# Patient Record
Sex: Female | Born: 1970
Health system: Southern US, Community
[De-identification: ages and names within clinical notes are randomized; demographics above are authoritative.]

## PROBLEM LIST (undated history)

## (undated) DIAGNOSIS — S060XAA Concussion with loss of consciousness status unknown, initial encounter: Secondary | ICD-10-CM

## (undated) DIAGNOSIS — E039 Hypothyroidism, unspecified: Secondary | ICD-10-CM

## (undated) DIAGNOSIS — M1710 Unilateral primary osteoarthritis, unspecified knee: Secondary | ICD-10-CM

## (undated) DIAGNOSIS — Z9889 Other specified postprocedural states: Secondary | ICD-10-CM

## (undated) DIAGNOSIS — F32A Depression, unspecified: Secondary | ICD-10-CM

## (undated) DIAGNOSIS — R112 Nausea with vomiting, unspecified: Secondary | ICD-10-CM

## (undated) DIAGNOSIS — R350 Frequency of micturition: Secondary | ICD-10-CM

## (undated) DIAGNOSIS — M1990 Unspecified osteoarthritis, unspecified site: Secondary | ICD-10-CM

## (undated) DIAGNOSIS — I10 Essential (primary) hypertension: Secondary | ICD-10-CM

## (undated) DIAGNOSIS — K5900 Constipation, unspecified: Secondary | ICD-10-CM

## (undated) DIAGNOSIS — F329 Major depressive disorder, single episode, unspecified: Secondary | ICD-10-CM

## (undated) DIAGNOSIS — K219 Gastro-esophageal reflux disease without esophagitis: Secondary | ICD-10-CM

## (undated) DIAGNOSIS — Z8489 Family history of other specified conditions: Secondary | ICD-10-CM

## (undated) DIAGNOSIS — F419 Anxiety disorder, unspecified: Secondary | ICD-10-CM

## (undated) DIAGNOSIS — G4730 Sleep apnea, unspecified: Secondary | ICD-10-CM

## (undated) DIAGNOSIS — S060X9A Concussion with loss of consciousness of unspecified duration, initial encounter: Secondary | ICD-10-CM

## (undated) HISTORY — PX: JOINT REPLACEMENT: SHX530

## (undated) HISTORY — DX: Unilateral primary osteoarthritis, unspecified knee: M17.10

## (undated) HISTORY — PX: EYE SURGERY: SHX253

---
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
## 1996-06-24 HISTORY — PX: CHOLECYSTECTOMY: SHX55

## 1998-06-24 HISTORY — PX: TUBAL LIGATION: SHX77

## 2003-06-10 ENCOUNTER — Ambulatory Visit (HOSPITAL_COMMUNITY): Admission: RE | Admit: 2003-06-10 | Discharge: 2003-06-10 | Payer: Self-pay | Admitting: Endocrinology

## 2004-12-05 ENCOUNTER — Encounter (HOSPITAL_COMMUNITY): Admission: RE | Admit: 2004-12-05 | Discharge: 2004-12-06 | Payer: Self-pay | Admitting: Endocrinology

## 2011-04-16 ENCOUNTER — Other Ambulatory Visit (HOSPITAL_COMMUNITY): Payer: Self-pay | Admitting: Endocrinology

## 2011-04-16 DIAGNOSIS — E0590 Thyrotoxicosis, unspecified without thyrotoxic crisis or storm: Secondary | ICD-10-CM

## 2011-05-13 ENCOUNTER — Ambulatory Visit (HOSPITAL_COMMUNITY): Payer: Self-pay

## 2011-06-14 ENCOUNTER — Ambulatory Visit (HOSPITAL_COMMUNITY)
Admission: RE | Admit: 2011-06-14 | Discharge: 2011-06-14 | Disposition: A | Discharge status: Home / Self Care | Payer: Self-pay | Source: Ambulatory Visit | Attending: Endocrinology | Admitting: Endocrinology

## 2011-06-14 ENCOUNTER — Encounter (HOSPITAL_COMMUNITY): Payer: Self-pay

## 2011-06-14 DIAGNOSIS — E0590 Thyrotoxicosis, unspecified without thyrotoxic crisis or storm: Secondary | ICD-10-CM

## 2011-06-14 MED ORDER — SODIUM IODIDE I 131 CAPSULE
30.0000 | Freq: Once | INTRAVENOUS | Status: AC | PRN
  Administered 2011-06-14: 30 via ORAL

## 2014-06-01 ENCOUNTER — Other Ambulatory Visit (HOSPITAL_COMMUNITY): Payer: Self-pay | Admitting: Orthopaedic Surgery

## 2014-06-09 ENCOUNTER — Encounter (HOSPITAL_COMMUNITY): Payer: Self-pay

## 2014-06-09 ENCOUNTER — Encounter (HOSPITAL_COMMUNITY)
Admission: RE | Admit: 2014-06-09 | Discharge: 2014-06-09 | Disposition: A | Discharge status: Home / Self Care | Payer: 59 | Source: Ambulatory Visit | Attending: Orthopaedic Surgery | Admitting: Orthopaedic Surgery

## 2014-06-09 ENCOUNTER — Emergency Department (HOSPITAL_COMMUNITY)
Admission: EM | Admit: 2014-06-09 | Discharge: 2014-06-09 | Disposition: A | Discharge status: Home / Self Care | Payer: 59 | Attending: Emergency Medicine | Admitting: Emergency Medicine

## 2014-06-09 ENCOUNTER — Encounter (HOSPITAL_COMMUNITY): Payer: Self-pay | Admitting: Cardiology

## 2014-06-09 ENCOUNTER — Ambulatory Visit (HOSPITAL_COMMUNITY)
Admission: RE | Admit: 2014-06-09 | Discharge: 2014-06-09 | Disposition: A | Discharge status: Home / Self Care | Payer: 59 | Source: Ambulatory Visit | Attending: Orthopaedic Surgery | Admitting: Orthopaedic Surgery

## 2014-06-09 ENCOUNTER — Other Ambulatory Visit: Payer: Self-pay

## 2014-06-09 DIAGNOSIS — M1990 Unspecified osteoarthritis, unspecified site: Secondary | ICD-10-CM | POA: Diagnosis not present

## 2014-06-09 DIAGNOSIS — F419 Anxiety disorder, unspecified: Secondary | ICD-10-CM | POA: Diagnosis not present

## 2014-06-09 DIAGNOSIS — F329 Major depressive disorder, single episode, unspecified: Secondary | ICD-10-CM | POA: Insufficient documentation

## 2014-06-09 DIAGNOSIS — Z8719 Personal history of other diseases of the digestive system: Secondary | ICD-10-CM | POA: Diagnosis not present

## 2014-06-09 DIAGNOSIS — J42 Unspecified chronic bronchitis: Secondary | ICD-10-CM | POA: Diagnosis not present

## 2014-06-09 DIAGNOSIS — E039 Hypothyroidism, unspecified: Secondary | ICD-10-CM | POA: Diagnosis not present

## 2014-06-09 DIAGNOSIS — Z7982 Long term (current) use of aspirin: Secondary | ICD-10-CM | POA: Diagnosis not present

## 2014-06-09 DIAGNOSIS — R079 Chest pain, unspecified: Secondary | ICD-10-CM

## 2014-06-09 DIAGNOSIS — Z3202 Encounter for pregnancy test, result negative: Secondary | ICD-10-CM | POA: Insufficient documentation

## 2014-06-09 DIAGNOSIS — Z79899 Other long term (current) drug therapy: Secondary | ICD-10-CM | POA: Diagnosis not present

## 2014-06-09 DIAGNOSIS — M1710 Unilateral primary osteoarthritis, unspecified knee: Secondary | ICD-10-CM

## 2014-06-09 HISTORY — DX: Other specified postprocedural states: Z98.890

## 2014-06-09 HISTORY — DX: Frequency of micturition: R35.0

## 2014-06-09 HISTORY — DX: Concussion with loss of consciousness status unknown, initial encounter: S06.0XAA

## 2014-06-09 HISTORY — DX: Gastro-esophageal reflux disease without esophagitis: K21.9

## 2014-06-09 HISTORY — DX: Sleep apnea, unspecified: G47.30

## 2014-06-09 HISTORY — DX: Constipation, unspecified: K59.00

## 2014-06-09 HISTORY — DX: Depression, unspecified: F32.A

## 2014-06-09 HISTORY — DX: Unspecified osteoarthritis, unspecified site: M19.90

## 2014-06-09 HISTORY — DX: Hypothyroidism, unspecified: E03.9

## 2014-06-09 HISTORY — DX: Family history of other specified conditions: Z84.89

## 2014-06-09 HISTORY — DX: Nausea with vomiting, unspecified: R11.2

## 2014-06-09 HISTORY — DX: Major depressive disorder, single episode, unspecified: F32.9

## 2014-06-09 HISTORY — DX: Concussion with loss of consciousness of unspecified duration, initial encounter: S06.0X9A

## 2014-06-09 HISTORY — DX: Anxiety disorder, unspecified: F41.9

## 2014-06-09 HISTORY — DX: Other specified postprocedural states: R11.2

## 2014-06-09 LAB — COMPREHENSIVE METABOLIC PANEL
ALT: 16 U/L (ref 0–35)
ALT: 14 U/L (ref 0–35)
AST: 15 U/L (ref 0–37)
AST: 13 U/L (ref 0–37)
Albumin: 4 g/dL (ref 3.5–5.2)
Albumin: 3.6 g/dL (ref 3.5–5.2)
Alkaline Phosphatase: 61 U/L (ref 39–117)
Alkaline Phosphatase: 51 U/L (ref 39–117)
Anion gap: 15 (ref 5–15)
Anion gap: 10 (ref 5–15)
BUN: 12 mg/dL (ref 6–23)
BUN: 13 mg/dL (ref 6–23)
CO2: 23 meq/L (ref 19–32)
CO2: 26 meq/L (ref 19–32)
Calcium: 9.9 mg/dL (ref 8.4–10.5)
Calcium: 9.3 mg/dL (ref 8.4–10.5)
Chloride: 100 meq/L (ref 96–112)
Chloride: 102 meq/L (ref 96–112)
Creatinine, Ser: 0.93 mg/dL (ref 0.50–1.10)
Creatinine, Ser: 0.95 mg/dL (ref 0.50–1.10)
GFR calc Af Amer: 85 mL/min — ABNORMAL LOW (ref >90)
GFR calc Af Amer: 83 mL/min — ABNORMAL LOW (ref >90)
GFR calc non Af Amer: 74 mL/min — ABNORMAL LOW (ref >90)
GFR calc non Af Amer: 72 mL/min — ABNORMAL LOW (ref >90)
Glucose, Bld: 84 mg/dL (ref 70–99)
Glucose, Bld: 90 mg/dL (ref 70–99)
Potassium: 4.1 meq/L (ref 3.7–5.3)
Potassium: 4.1 meq/L (ref 3.7–5.3)
Sodium: 138 meq/L (ref 137–147)
Sodium: 138 meq/L (ref 137–147)
Total Bilirubin: 0.3 mg/dL (ref 0.3–1.2)
Total Bilirubin: 0.2 mg/dL — ABNORMAL LOW (ref 0.3–1.2)
Total Protein: 7.4 g/dL (ref 6.0–8.3)
Total Protein: 6.7 g/dL (ref 6.0–8.3)

## 2014-06-09 LAB — URINALYSIS, ROUTINE W REFLEX MICROSCOPIC
Bilirubin Urine: NEGATIVE
Glucose, UA: NEGATIVE mg/dL
Hgb urine dipstick: NEGATIVE
Ketones, ur: NEGATIVE mg/dL
Nitrite: NEGATIVE
Protein, ur: NEGATIVE mg/dL
Specific Gravity, Urine: 1.023 (ref 1.005–1.030)
Urobilinogen, UA: 0.2 mg/dL (ref 0.0–1.0)
pH: 5 (ref 5.0–8.0)

## 2014-06-09 LAB — CBC WITH DIFFERENTIAL/PLATELET
Basophils Absolute: 0 10*3/uL (ref 0.0–0.1)
Basophils Relative: 0 % (ref 0–1)
Eosinophils Absolute: 0.2 10*3/uL (ref 0.0–0.7)
Eosinophils Relative: 2 % (ref 0–5)
HCT: 38.4 % (ref 36.0–46.0)
Hemoglobin: 12.8 g/dL (ref 12.0–15.0)
Lymphocytes Relative: 28 % (ref 12–46)
Lymphs Abs: 2.6 10*3/uL (ref 0.7–4.0)
MCH: 27.6 pg (ref 26.0–34.0)
MCHC: 33.3 g/dL (ref 30.0–36.0)
MCV: 82.8 fL (ref 78.0–100.0)
Monocytes Absolute: 0.6 10*3/uL (ref 0.1–1.0)
Monocytes Relative: 6 % (ref 3–12)
Neutro Abs: 6 10*3/uL (ref 1.7–7.7)
Neutrophils Relative %: 64 % (ref 43–77)
Platelets: 255 10*3/uL (ref 150–400)
RBC: 4.64 MIL/uL (ref 3.87–5.11)
RDW: 13.4 % (ref 11.5–15.5)
WBC: 9.4 10*3/uL (ref 4.0–10.5)

## 2014-06-09 LAB — PREGNANCY, URINE: Preg Test, Ur: NEGATIVE

## 2014-06-09 LAB — URINE MICROSCOPIC-ADD ON

## 2014-06-09 LAB — CBC
HCT: 39.2 % (ref 36.0–46.0)
Hemoglobin: 13.2 g/dL (ref 12.0–15.0)
MCH: 27.7 pg (ref 26.0–34.0)
MCHC: 33.7 g/dL (ref 30.0–36.0)
MCV: 82.2 fL (ref 78.0–100.0)
Platelets: 295 10*3/uL (ref 150–400)
RBC: 4.77 MIL/uL (ref 3.87–5.11)
RDW: 13.3 % (ref 11.5–15.5)
WBC: 8.4 10*3/uL (ref 4.0–10.5)

## 2014-06-09 LAB — PROTIME-INR
INR: 1.07 (ref 0.00–1.49)
Prothrombin Time: 14 s (ref 11.6–15.2)

## 2014-06-09 LAB — TROPONIN I: Troponin I: <0.3 ng/mL (ref <0.30)

## 2014-06-09 LAB — SURGICAL PCR SCREEN
MRSA, PCR: NEGATIVE
Staphylococcus aureus: NEGATIVE

## 2014-06-09 LAB — I-STAT TROPONIN, ED: Troponin i, poc: 0 ng/mL (ref 0.00–0.08)

## 2014-06-09 MED ORDER — ASPIRIN 81 MG PO CHEW
324.0000 mg | CHEWABLE_TABLET | Freq: Once | ORAL | Status: AC
  Administered 2014-06-09: 324 mg via ORAL
  Filled 2014-06-09: qty 4

## 2014-06-09 MED ORDER — ACETAMINOPHEN 325 MG PO TABS
650.0000 mg | ORAL_TABLET | Freq: Once | ORAL | Status: AC
  Administered 2014-06-09: 650 mg via ORAL
  Filled 2014-06-09: qty 2

## 2014-06-09 MED ORDER — NITROGLYCERIN 0.4 MG SL SUBL
0.4000 mg | SUBLINGUAL_TABLET | SUBLINGUAL | Status: DC | PRN
  Administered 2014-06-09 (×2): 0.4 mg via SUBLINGUAL
  Filled 2014-06-09 (×3): qty 1

## 2014-06-09 NOTE — Progress Notes (Addendum)
Anesthesia consult:  Pt is 43 year old female scheduled for computer assisted R total knee arthroplasty on 06/22/2014 with Dr. Ophelia CharterYates.   PMH: anginal pain, hypothyroidism, SOB with exertion.   Pt reported at PAT having recurrent chest pain, describes as squeezing, pressure feeling in mid chest. Does not radiate. Associated with SOB. Seen at an urgent care in [place] for this 03/08/2014 and told it was anxiety. Rx ativan and chest pain symptoms improved but never completely resolved. Has chest pain almost daily. Can last anywhere from 30 minutes to all day long. Always starts when pt is active, never occurs at rest. Sometimes resting will resolve sx, sometimes sx persist despite rest. Today's sx began when pt was walking from parking deck into building for appointment.   Denies family hx of heart disease.   PCP is Doreen Beamhruv Vyas at [facility]. Has not seen cardiologist.   Lungs CTA B. CVRRR.   Pt transported to ER for eval of chest pain.   EKG at urgent care 03/08/14 showed sinus rhythm, septal infarct.   EKG 06/09/2014: NSR. Cannot rule out anterior infarct, age undetermined.   Preoperative labs reviewed.    Discussed case with Dr. Aleene DavidsonE. Fitzgerald. Pt will need cardiac clearance prior to surgery.   Rica Mastngela Kabbe, FNP-BC [facility]Anesthesiology Phone: [phone] 06/09/2014 3:14 PM  Addendum: Patient was evaluated by Ronie Spiesayna Dunn, PA-C with [facility] for chest pain and pre-operative evaluation on 06/10/14.  Nuclear stress test was done on 06/16/14 and showed: Overall Impression: Normal stress nuclear study.LV Ejection Fraction: 78%. LV Wall Motion: NL LV Function; NL Wall Motion.  Patient was subsequently cleared for surgery with PRN cardiology follow-up recommended.  Velna Ochsllison Man Effertz, PA-C [facility]Anesthesiology Phone [phone] 06/20/2014 9:48 AM

## 2014-06-09 NOTE — ED Notes (Signed)
Pt reports that she was here today for pre-op on her right knee. States that they did an EKG on her because she has been having intermittent chest pain and was brought here for evaluation. Pt reports some mild chest pain at this time.

## 2014-06-09 NOTE — ED Notes (Signed)
Other labs completed at pre op

## 2014-06-09 NOTE — ED Notes (Signed)
Phlebotomy at the bedside  

## 2014-06-09 NOTE — Discharge Instructions (Signed)

## 2014-06-09 NOTE — Pre-Procedure Instructions (Addendum)
Mary Paul  06/09/2014   Your procedure is scheduled on:  Wednesday, December 30.  Report to [facility] Admitting at 1:00PM.  Call this number if you have problems the morning of surgery: [phone]                For any other questions, please call [phone], Monday - Friday 8 AM - 4 PM.  Remember:   Do not eat food or drink liquids after midnight Tuesday, December 29.   Take these medicines the morning of surgery with A SIP OF WATER: - Busprione, Levothyroxine.                On December 23 stop taking Ibuprofen, and BC powder.    Do not wear jewelry, make-up or nail polish.  Do not wear lotions, powders, or perfumes.   Do not shave 48 hours prior to surgery.   Do not bring valuables to the hospital.                [facility] is not responsible  for any belongings or valuables.               Contacts, dentures or bridgework may not be worn into surgery.  Leave suitcase in the car. After surgery it may be brought to your room.  For patients admitted to the hospital, discharge time is determined by your treatment team.                 Special Instructions: Review  [place] - Preparing For Surgery.   Please read over the following fact sheets that you were given: Pain Booklet, Coughing and Deep Breathing and Surgical Site Infection Prevention and Incentive Spirometer.  

## 2014-06-09 NOTE — Pre-Procedure Instructions (Addendum)
Mary Paul  06/09/2014   Your procedure is scheduled on:  Wednesday, December 30.  Report to [facility] Admitting at 1:00PM.  Call this number if you have problems the morning of surgery: [phone]                For any other questions, please call [phone], Monday - Friday 8 AM - 4 PM.  Remember:   Do not eat food or drink liquids after midnight Tuesday, December 29.   Take these medicines the morning of surgery with A SIP OF WATER: - Busprione, Levothyroxine.                On December 23 stop taking Ibuprofen, and BC powder.    Do not wear jewelry, make-up or nail polish.  Do not wear lotions, powders, or perfumes.   Do not shave 48 hours prior to surgery.   Do not bring valuables to the hospital.                [facility] is not responsible  for any belongings or valuables.               Contacts, dentures or bridgework may not be worn into surgery.  Leave suitcase in the car. After surgery it may be brought to your room.  For patients admitted to the hospital, discharge time is determined by your treatment team.                 Special Instructions: Review   - Preparing For Surgery.   Please read over the following fact sheets that you were given: Pain Booklet, Coughing and Deep Breathing and Surgical Site Infection Prevention and Incentive Spirometer.  

## 2014-06-09 NOTE — Pre-Procedure Instructions (Signed)
Elanore Jiminez  06/09/2014   Your procedure is scheduled on:  Wednesday, December 30.  Report to [facility] Admitting at 1:00PM.  Call this number if you have problems the morning of surgery: [phone]                For any other questions, please call [phone], Monday - Friday 8 AM - 4 PM.  Remember:   Do not eat food or drink liquids after midnight Tuesday, December 29.   Take these medicines the morning of surgery with A SIP OF WATER: - Busprione, Levothyroxine.                On December 23 stop taking Ibuprofen, and BC powder.    Do not wear jewelry, make-up or nail polish.  Do not wear lotions, powders, or perfumes.   Do not shave 48 hours prior to surgery.   Do not bring valuables to the hospital.                [facility] is not responsible  for any belongings or valuables.               Contacts, dentures or bridgework may not be worn into surgery.  Leave suitcase in the car. After surgery it may be brought to your room.  For patients admitted to the hospital, discharge time is determined by your treatment team.                 Special Instructions: Review  [place] - Preparing For Surgery.   Please read over the following fact sheets that you were given: Pain Booklet, Coughing and Deep Breathing and Surgical Site Infection Prevention and Incentive Spirometer.  

## 2014-06-09 NOTE — Progress Notes (Signed)
Mrs Mary Paul reports chest pain, mid chest, clenching feeling.  Patient states she does get short of breat, no lightheadedness or diaphoresis. Mrs Mary Paul was seen at [facility] [place] [place] for chest pain in the past few months.  "They did EKG, Xrays and  said it is anxiety."  Patient was started on Ativan, which relieved chest pain, see is no longer on Ativan and chest pain has come back.  No chest pain at this time.  I faxed a request to [facility], [place] and notified [name], from Anesthesia, who will see patient prior to her leaving today.

## 2014-06-09 NOTE — ED Notes (Addendum)
Pt stated that she is a hard stick, declines IV attempt and NTG at this time. Pt states that she is comfortable and that the ASA helped with her chest discomfort.

## 2014-06-09 NOTE — Pre-Procedure Instructions (Signed)
Mary Paul  06/09/2014   Your procedure is scheduled on:  Wednesday, December 30.  [facility] Admitting at 1:00PM.  Call this number if you have problems the morning of surgery: [phone]                For any other questions, please call [phone], Monday - Friday 8 AM - 4 PM.  Remember:   Do not eat food or drink liquids after midnight Tuesday, December 29.   Take these medicines the morning of surgery with A SIP OF WATER: - Busprione, Levothyroxine.                On December 23 stop taking Ibuprofen, and BC powder.    Do not wear jewelry, make-up or nail polish.  Do not wear lotions, powders, or perfumes.   Do not shave 48 hours prior to surgery.   Do not bring valuables to the hospital.                [facility] is not responsible  for any belongings or valuables.               Contacts, dentures or bridgework may not be worn into surgery.  Leave suitcase in the car. After surgery it may be brought to your room.  For patients admitted to the hospital, discharge time is determined by your treatment team.                 Special Instructions: Review  [place] - Preparing For Surgery.   Please read over the following fact sheets that you were given: Pain Booklet, Coughing and Deep Breathing and Surgical Site Infection Prevention and Incentive Spirometer.

## 2014-06-09 NOTE — ED Notes (Signed)
Pt ambulated to bathroom and back, pt denies increase in chest pain, or shortness of breath. Pt vitals are wnl.

## 2014-06-09 NOTE — ED Notes (Addendum)
Attempted to draw pts labs was unsuccessful called phlebotomy and spoke with Rulon EisenmengerFelix. Phlebotomy will draw pts labs.

## 2014-06-10 ENCOUNTER — Encounter: Payer: Self-pay | Admitting: Physician Assistant

## 2014-06-10 ENCOUNTER — Ambulatory Visit (INDEPENDENT_AMBULATORY_CARE_PROVIDER_SITE_OTHER): Payer: 59 | Admitting: Physician Assistant

## 2014-06-10 VITALS — BP 144/82 | HR 84 | Ht 62.0 in | Wt 261.0 lb

## 2014-06-10 DIAGNOSIS — E890 Postprocedural hypothyroidism: Secondary | ICD-10-CM | POA: Insufficient documentation

## 2014-06-10 DIAGNOSIS — E785 Hyperlipidemia, unspecified: Secondary | ICD-10-CM

## 2014-06-10 DIAGNOSIS — Z01810 Encounter for preprocedural cardiovascular examination: Secondary | ICD-10-CM | POA: Insufficient documentation

## 2014-06-10 DIAGNOSIS — R079 Chest pain, unspecified: Secondary | ICD-10-CM

## 2014-06-10 DIAGNOSIS — R072 Precordial pain: Secondary | ICD-10-CM

## 2014-06-10 DIAGNOSIS — E039 Hypothyroidism, unspecified: Secondary | ICD-10-CM

## 2014-06-10 HISTORY — DX: Encounter for preprocedural cardiovascular examination: Z01.810
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
## 2014-06-10 NOTE — Patient Instructions (Signed)
Your physician recommends that you continue on your current medications as directed. Please refer to the Current Medication list given to you today.    Your physician has requested that you have a lexiscan myoview. For further information please visit [email or web address]. Please follow instruction sheet, as given. IF POSSIBLE SCHEDULE BEFORE 06/22/14  HAVING KNEE SURGERY    Your physician recommends that you schedule a follow-up appointment in:  WITH DR Anne FuSKAINS  OR AVAILABLE APP  AFTER  Eugenie BirksLEXISCAN
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
## 2014-06-14 ENCOUNTER — Ambulatory Visit (HOSPITAL_COMMUNITY): Payer: 59 | Attending: Cardiology | Admitting: Radiology

## 2014-06-14 DIAGNOSIS — R072 Precordial pain: Secondary | ICD-10-CM

## 2014-06-14 DIAGNOSIS — R0600 Dyspnea, unspecified: Secondary | ICD-10-CM | POA: Insufficient documentation

## 2014-06-14 DIAGNOSIS — R079 Chest pain, unspecified: Secondary | ICD-10-CM | POA: Diagnosis present

## 2014-06-14 DIAGNOSIS — Z6841 Body Mass Index (BMI) 40.0 and over, adult: Secondary | ICD-10-CM | POA: Insufficient documentation

## 2014-06-14 DIAGNOSIS — R5383 Other fatigue: Secondary | ICD-10-CM | POA: Diagnosis not present

## 2014-06-14 DIAGNOSIS — R42 Dizziness and giddiness: Secondary | ICD-10-CM | POA: Insufficient documentation

## 2014-06-14 DIAGNOSIS — Z01810 Encounter for preprocedural cardiovascular examination: Secondary | ICD-10-CM

## 2014-06-14 MED ORDER — TECHNETIUM TC 99M SESTAMIBI GENERIC - CARDIOLITE
30.0000 | Freq: Once | INTRAVENOUS | Status: AC | PRN
  Administered 2014-06-14: 30 via INTRAVENOUS

## 2014-06-16 ENCOUNTER — Ambulatory Visit (HOSPITAL_COMMUNITY): Payer: 59 | Attending: Cardiology | Admitting: Radiology

## 2014-06-16 DIAGNOSIS — R0989 Other specified symptoms and signs involving the circulatory and respiratory systems: Secondary | ICD-10-CM

## 2014-06-16 MED ORDER — REGADENOSON 0.4 MG/5ML IV SOLN
0.4000 mg | Freq: Once | INTRAVENOUS | Status: AC
  Administered 2014-06-16: 0.4 mg via INTRAVENOUS

## 2014-06-16 MED ORDER — TECHNETIUM TC 99M SESTAMIBI GENERIC - CARDIOLITE
30.0000 | Freq: Once | INTRAVENOUS | Status: AC | PRN
  Administered 2014-06-16: 30 via INTRAVENOUS

## 2014-06-16 NOTE — Progress Notes (Unsigned)
[facility] SITE 3 NUCLEAR MED [address] [place], [place] [address] [phone]    Cardiology Nuclear Med Study  Mary Paul is a 43 y.o. female     MRN : [id number]     DOB: 05/06/1971  Procedure Date: 06/16/2014  Nuclear Med Background Indication for Stress Test:  Evaluation for Ischemia and Surgical Clearance - Knee Surgery ( Dr. Annell GreeningMark Yates, MD History:  {CHL HISTORY STRESS OZHY:86578}TEST:22964} Cardiac Risk Factors: N/A  Symptoms:  {CHL SYMPTOMS STRESS IONG:29528413}TEST:21021013}   Nuclear Pre-Procedure Caffeine/Decaff Intake:  None NPO After: 9:00pm   Lungs:  {Exam; lungs brief:12271} O2 Sat: ***% on {Exam; oxygen delivery:30093}. IV 0.9% NS with Angio Cath:  22g  IV Site: R Hand  IV Started by:  Bonnita LevanJackie Lindia Garms, RN  Chest Size (in):  44 Cup Size: DDD  Height: 5\' 2"  (1.575 m)  Weight:  260 lb (117.935 kg)  BMI:  Body mass index is 47.54 kg/(m^2). Tech Comments:  N/A    Nuclear Med Study 1 or 2 day study: 2 day  Stress Test Type:  Lexiscan  Reading MD: N/A  Order Authorizing Provider:  Donato SchultzMark Skains, MD  Resting Radionuclide: Technetium 3577m Sestamibi  Resting Radionuclide Dose: *** mCi   Stress Radionuclide:  Technetium 6177m Sestamibi  Stress Radionuclide Dose: *** mCi           Stress Protocol Rest HR: *** Stress HR: ***  Rest BP: *** Stress BP: ***  Exercise Time (min): {NA AND WILDCARD:21589} METS: {NA AND KGMWNUUV:25366}WILDCARD:21589}           Dose of Adenosine (mg):  {NA AND YQIHKVQQ:59563}WILDCARD:21589} Dose of Lexiscan: {CHL CARD WILDCARD AND 0.4:21590} mg  Dose of Atropine (mg): {NA AND OVFIEPPI:95188}WILDCARD:21589} Dose of Dobutamine: {NA AND WILDCARD:21589} mcg/kg/min (at max HR)  Stress Test Technologist: {CHL LB STRESS TEST TECHNOLOGIST:21021024}  Nuclear Technologist:  {CHL LB NUCLEAR TECHNOLOGIST:21021025}     Rest Procedure:  {CHL REST PROCEDURE NUCLEAR:21021027} Rest ECG: {CHL REST CZY:60630}ECG:21559}  Stress Procedure:  {CHL STRESS PROCEDURE NUCLEAR:21021028} Stress ECG: {CHL CAR STRESS  ECG:21561}  QPS Raw Data Images:  {CHL RAW DATA IMAGES NUC:21021029} Stress Images:  {CHL STRESS IMAGES NUC:21021030} Rest Images:  {CHL REST IMAGES NUC:21021031} Subtraction (SDS):  {CHL SUBTRACTION (SDS) NUC:21021032} Transient Ischemic Dilatation (Normal <1.22):  *** Lung/Heart Ratio (Normal <0.45):  ***  Quantitative Gated Spect Images QGS EDV:  *** ml QGS ESV:  *** ml  Impression Exercise Capacity:  {CHL EXERCISE CAPACITY NUC:21021037} BP Response:  {CHL BP RESPONSE NUC:21021038} Clinical Symptoms:  {CHL CLINICAL SYMPTOMS NUC:21021039} ECG Impression:  {CHL ECG IMPRESSION NUC:21021040} Comparison with Prior Nuclear Study: {CHL NUCLEAR STUDY COMPARISON:21562}  Overall Impression:  {CHL OVERALL IMPRESSION NUC:21021041}  LV Ejection Fraction: {CHL CARD STUDY NOT GATED:21592:o}.  LV Wall Motion:  {CHL CARD QGS:21591:o}

## 2014-06-16 NOTE — Progress Notes (Signed)
[facility] SITE 3 NUCLEAR MED [address] [place], [place] [address] [phone]    Cardiology Nuclear Med Study  Mary Paul is a 43 y.o. female     MRN : [id number]     DOB: 04/20/1971  Procedure Date: 06/16/2014  Nuclear Med Background Indication for Stress Test:  Evaluation for Ischemia and Surgical Clearance, Post ER visit for chest pain with negative enzymes History:  No known CAD Cardiac Risk Factors: Obesity  Symptoms:  Chest Pain (last date of chest discomfort is chronic), DOE and Fatigue   Nuclear Pre-Procedure Caffeine/Decaff Intake:  None NPO After: 9:00pm   Lungs:  clear O2 Sat: 98% on room air. IV 0.9% NS with Angio Cath:  22g  IV Site: R Hand  IV Started by:  Bonnita LevanJackie Smith, RN  Chest Size (in):  44 Cup Size: DDD  Height: 5\' 2"  (1.575 m)  Weight:  260 lb (117.935 kg)  BMI:  Body mass index is 47.54 kg/(m^2). Tech Comments:  n/a    Nuclear Med Study 1 or 2 day study: 2 day  Stress Test Type:  Lexiscan  Reading MD: Charlton HawsPeter Leiani Enright, MD  Order Authorizing Provider:  Donato SchultzMark Skains, MD  Resting Radionuclide: Technetium 3858m Sestamibi  Resting Radionuclide Dose: 33.0 mCi  On      06-14-14  Stress Radionuclide:  Technetium 2358m Sestamibi  Stress Radionuclide Dose: 33.0 mCi  On         06-16-14          Stress Protocol Rest HR: 85 Stress HR: 112  Rest BP: 121/78 Stress BP: 119/64  Exercise Time (min): n/a METS: n/a   Predicted Max HR: 176 bpm % Max HR: 63.64 bpm Rate Pressure Product: [id number]   Dose of Adenosine (mg):  n/a Dose of Lexiscan: 0.4 mg  Dose of Atropine (mg): n/a Dose of Dobutamine: n/a mcg/kg/min (at max HR)  Stress Test Technologist: Nelson ChimesSharon Brooks, BS-ES  Nuclear Technologist:  Harlow AsaElizabeth Young, CNMT     Rest Procedure:  Myocardial perfusion imaging was performed at rest 45 minutes following the intravenous administration of Technetium 2358m Sestamibi. Rest ECG: NSR - Normal EKG  Stress Procedure:  The patient received IV Lexiscan 0.4  mg over 15-seconds.  Technetium 2758m Sestamibi injected at 30-seconds.  Quantitative spect images were obtained after a 45 minute delay.  During the infusion of Lexiscan the patient complained of feeling heart rate increase and lightheadedness.  These symptoms began to resolve in recovery.  Stress ECG: No significant change from baseline ECG  QPS Raw Data Images:  Normal; no motion artifact; normal heart/lung ratio. Stress Images:  Normal homogeneous uptake in all areas of the myocardium. Rest Images:  Normal homogeneous uptake in all areas of the myocardium. Subtraction (SDS):  Normal Transient Ischemic Dilatation (Normal <1.22):  1.03 Lung/Heart Ratio (Normal <0.45):  0.26  Quantitative Gated Spect Images QGS EDV:  72 ml QGS ESV:  16 ml  Impression Exercise Capacity:  Lexiscan with no exercise. BP Response:  Normal blood pressure response. Clinical Symptoms:  Light Headed ECG Impression:  No significant ST segment change suggestive of ischemia. Comparison with Prior Nuclear Study: No images to compare  Overall Impression:  Normal stress nuclear study.  LV Ejection Fraction: 78%.  LV Wall Motion:  NL LV Function; NL Wall Motion  Charlton HawsPeter Taralee Marcus

## 2014-06-20 ENCOUNTER — Telehealth: Payer: Self-pay | Admitting: *Deleted

## 2014-06-20 ENCOUNTER — Ambulatory Visit: Payer: 59 | Admitting: Physician Assistant

## 2014-06-20 NOTE — Telephone Encounter (Signed)
Pt  notified about myoview results and that she is cleared for knee surgery per Bernette Mayersanya Dunn, PA. Pt aware only needs PRN f/u if needed. I will [facility] to [facility] pt cleared for surgery. Pt said thank you.

## 2014-06-21 MED ORDER — CHLORHEXIDINE GLUCONATE 4 % EX LIQD
60.0000 mL | Freq: Once | CUTANEOUS | Status: DC
  Filled 2014-06-21: qty 60

## 2014-06-21 MED ORDER — CEFAZOLIN SODIUM-DEXTROSE 2-3 GM-% IV SOLR
2.0000 g | INTRAVENOUS | Status: AC
  Administered 2014-06-22: 2 g via INTRAVENOUS
  Filled 2014-06-21: qty 50

## 2014-06-22 ENCOUNTER — Inpatient Hospital Stay (HOSPITAL_COMMUNITY): Payer: 59 | Admitting: Anesthesiology

## 2014-06-22 ENCOUNTER — Inpatient Hospital Stay (HOSPITAL_COMMUNITY): Payer: 59 | Admitting: Emergency Medicine

## 2014-06-22 ENCOUNTER — Inpatient Hospital Stay (HOSPITAL_COMMUNITY)
Admission: RE | Admit: 2014-06-22 | Discharge: 2014-06-27 | DRG: 470 | Disposition: A | Discharge status: Skilled Nursing Facility | Payer: 59 | Source: Ambulatory Visit | Attending: Orthopaedic Surgery | Admitting: Orthopaedic Surgery

## 2014-06-22 ENCOUNTER — Encounter (HOSPITAL_COMMUNITY)
Admission: RE | Disposition: A | Discharge status: Skilled Nursing Facility | Payer: Self-pay | Source: Ambulatory Visit | Attending: Orthopaedic Surgery

## 2014-06-22 ENCOUNTER — Encounter (HOSPITAL_COMMUNITY): Payer: Self-pay | Admitting: *Deleted

## 2014-06-22 DIAGNOSIS — F419 Anxiety disorder, unspecified: Secondary | ICD-10-CM | POA: Diagnosis present

## 2014-06-22 DIAGNOSIS — M25561 Pain in right knee: Secondary | ICD-10-CM | POA: Diagnosis present

## 2014-06-22 DIAGNOSIS — G4730 Sleep apnea, unspecified: Secondary | ICD-10-CM | POA: Diagnosis present

## 2014-06-22 DIAGNOSIS — E039 Hypothyroidism, unspecified: Secondary | ICD-10-CM | POA: Diagnosis present

## 2014-06-22 DIAGNOSIS — M21161 Varus deformity, not elsewhere classified, right knee: Secondary | ICD-10-CM | POA: Diagnosis present

## 2014-06-22 DIAGNOSIS — Z96659 Presence of unspecified artificial knee joint: Secondary | ICD-10-CM

## 2014-06-22 DIAGNOSIS — G43909 Migraine, unspecified, not intractable, without status migrainosus: Secondary | ICD-10-CM | POA: Diagnosis present

## 2014-06-22 DIAGNOSIS — K219 Gastro-esophageal reflux disease without esophagitis: Secondary | ICD-10-CM | POA: Diagnosis present

## 2014-06-22 DIAGNOSIS — M24561 Contracture, right knee: Secondary | ICD-10-CM | POA: Diagnosis present

## 2014-06-22 DIAGNOSIS — F329 Major depressive disorder, single episode, unspecified: Secondary | ICD-10-CM | POA: Diagnosis present

## 2014-06-22 DIAGNOSIS — M179 Osteoarthritis of knee, unspecified: Secondary | ICD-10-CM | POA: Diagnosis present

## 2014-06-22 DIAGNOSIS — M1710 Unilateral primary osteoarthritis, unspecified knee: Secondary | ICD-10-CM | POA: Diagnosis present

## 2014-06-22 HISTORY — PX: KNEE ARTHROPLASTY: SHX992

## 2014-06-22 SURGERY — ARTHROPLASTY, KNEE, TOTAL, USING IMAGELESS COMPUTER-ASSISTED NAVIGATION
Anesthesia: Regional | Site: Knee | Laterality: Right

## 2014-06-22 MED ORDER — FENTANYL CITRATE 0.05 MG/ML IJ SOLN
INTRAMUSCULAR | Status: DC | PRN
  Administered 2014-06-22: 150 ug via INTRAVENOUS
  Administered 2014-06-22: 50 ug via INTRAVENOUS
  Administered 2014-06-22: 100 ug via INTRAVENOUS
  Administered 2014-06-22: 50 ug via INTRAVENOUS

## 2014-06-22 MED ORDER — FENTANYL CITRATE 0.05 MG/ML IJ SOLN
INTRAMUSCULAR | Status: AC
  Filled 2014-06-22: qty 5

## 2014-06-22 MED ORDER — PHENOL 1.4 % MT LIQD: 1.0000 | OROMUCOSAL | Status: DC | PRN

## 2014-06-22 MED ORDER — METHOCARBAMOL 500 MG PO TABS
500.0000 mg | ORAL_TABLET | Freq: Four times a day (QID) | ORAL | Status: DC | PRN
  Administered 2014-06-23 – 2014-06-27 (×13): 500 mg via ORAL
  Filled 2014-06-22 (×14): qty 1

## 2014-06-22 MED ORDER — ROPIVACAINE HCL 5 MG/ML IJ SOLN
INTRAMUSCULAR | Status: DC | PRN
  Administered 2014-06-22: 30 mL via PERINEURAL

## 2014-06-22 MED ORDER — BUPIVACAINE HCL (PF) 0.25 % IJ SOLN
INTRAMUSCULAR | Status: AC
  Filled 2014-06-22: qty 30

## 2014-06-22 MED ORDER — PROPOFOL 10 MG/ML IV BOLUS
INTRAVENOUS | Status: AC
  Filled 2014-06-22: qty 20

## 2014-06-22 MED ORDER — MEPERIDINE HCL 25 MG/ML IJ SOLN: 6.2500 mg | INTRAMUSCULAR | Status: DC | PRN

## 2014-06-22 MED ORDER — ACETAMINOPHEN 325 MG PO TABS: 650.0000 mg | ORAL_TABLET | Freq: Four times a day (QID) | ORAL | Status: DC | PRN

## 2014-06-22 MED ORDER — ONDANSETRON HCL 4 MG PO TABS: 4.0000 mg | ORAL_TABLET | Freq: Four times a day (QID) | ORAL | Status: DC | PRN

## 2014-06-22 MED ORDER — FENTANYL CITRATE 0.05 MG/ML IJ SOLN
INTRAMUSCULAR | Status: AC
  Administered 2014-06-22: 100 ug via INTRAVENOUS
  Filled 2014-06-22: qty 2

## 2014-06-22 MED ORDER — METHOCARBAMOL 1000 MG/10ML IJ SOLN
500.0000 mg | Freq: Four times a day (QID) | INTRAVENOUS | Status: DC | PRN
  Administered 2014-06-22: 500 mg via INTRAVENOUS
  Filled 2014-06-22 (×2): qty 5

## 2014-06-22 MED ORDER — HYDROMORPHONE HCL 1 MG/ML IJ SOLN
1.0000 mg | INTRAMUSCULAR | Status: DC | PRN
  Administered 2014-06-22 – 2014-06-23 (×2): 1 mg via INTRAVENOUS
  Filled 2014-06-22 (×2): qty 1

## 2014-06-22 MED ORDER — INFLUENZA VAC SPLIT QUAD 0.5 ML IM SUSY
0.5000 mL | PREFILLED_SYRINGE | INTRAMUSCULAR | Status: AC
  Administered 2014-06-23: 0.5 mL via INTRAMUSCULAR
  Filled 2014-06-22: qty 0.5

## 2014-06-22 MED ORDER — SODIUM CHLORIDE 0.9 % IR SOLN
Status: DC | PRN
  Administered 2014-06-22: 1000 mL

## 2014-06-22 MED ORDER — ACETAMINOPHEN 500 MG PO TABS
1000.0000 mg | ORAL_TABLET | Freq: Four times a day (QID) | ORAL | Status: AC
  Administered 2014-06-23 (×4): 1000 mg via ORAL
  Filled 2014-06-22 (×6): qty 2

## 2014-06-22 MED ORDER — LIDOCAINE HCL (CARDIAC) 20 MG/ML IV SOLN
INTRAVENOUS | Status: DC | PRN
  Administered 2014-06-22: 60 mg via INTRAVENOUS

## 2014-06-22 MED ORDER — LACTATED RINGERS IV SOLN
INTRAVENOUS | Status: DC
  Administered 2014-06-22 (×2): via INTRAVENOUS

## 2014-06-22 MED ORDER — ONDANSETRON HCL 4 MG/2ML IJ SOLN
INTRAMUSCULAR | Status: DC | PRN
  Administered 2014-06-22 (×2): 4 mg via INTRAVENOUS

## 2014-06-22 MED ORDER — SUCCINYLCHOLINE CHLORIDE 20 MG/ML IJ SOLN
INTRAMUSCULAR | Status: DC | PRN
  Administered 2014-06-22: 100 mg via INTRAVENOUS

## 2014-06-22 MED ORDER — PHENYLEPHRINE HCL 10 MG/ML IJ SOLN
INTRAMUSCULAR | Status: DC | PRN
  Administered 2014-06-22: 40 ug via INTRAVENOUS
  Administered 2014-06-22: 80 ug via INTRAVENOUS
  Administered 2014-06-22: 40 ug via INTRAVENOUS

## 2014-06-22 MED ORDER — DEXAMETHASONE SODIUM PHOSPHATE 4 MG/ML IJ SOLN
INTRAMUSCULAR | Status: DC | PRN
  Administered 2014-06-22: 10 mg via INTRAVENOUS

## 2014-06-22 MED ORDER — OXYCODONE HCL 5 MG PO TABS
10.0000 mg | ORAL_TABLET | ORAL | Status: DC | PRN
  Administered 2014-06-22 – 2014-06-27 (×21): 10 mg via ORAL
  Filled 2014-06-22 (×20): qty 2

## 2014-06-22 MED ORDER — POTASSIUM CHLORIDE IN NACL 20-0.45 MEQ/L-% IV SOLN
INTRAVENOUS | Status: DC
  Administered 2014-06-23: via INTRAVENOUS
  Filled 2014-06-22 (×12): qty 1000

## 2014-06-22 MED ORDER — METOCLOPRAMIDE HCL 10 MG PO TABS: 5.0000 mg | ORAL_TABLET | Freq: Three times a day (TID) | ORAL | Status: DC | PRN

## 2014-06-22 MED ORDER — FENTANYL CITRATE 0.05 MG/ML IJ SOLN
25.0000 ug | INTRAMUSCULAR | Status: DC | PRN
  Administered 2014-06-22: 100 ug via INTRAVENOUS
  Administered 2014-06-22: 25 ug via INTRAVENOUS
  Administered 2014-06-22 (×2): 50 ug via INTRAVENOUS
  Administered 2014-06-22: 25 ug via INTRAVENOUS

## 2014-06-22 MED ORDER — FENTANYL CITRATE 0.05 MG/ML IJ SOLN
INTRAMUSCULAR | Status: AC
  Administered 2014-06-22: 25 ug via INTRAVENOUS
  Filled 2014-06-22: qty 2

## 2014-06-22 MED ORDER — OXYCODONE HCL 5 MG PO TABS
ORAL_TABLET | ORAL | Status: AC
  Administered 2014-06-22: 10 mg via ORAL
  Filled 2014-06-22: qty 2

## 2014-06-22 MED ORDER — MIDAZOLAM HCL 2 MG/2ML IJ SOLN
INTRAMUSCULAR | Status: AC
  Administered 2014-06-22: 2 mg
  Filled 2014-06-22: qty 2

## 2014-06-22 MED ORDER — MENTHOL 3 MG MT LOZG: 1.0000 | LOZENGE | OROMUCOSAL | Status: DC | PRN

## 2014-06-22 MED ORDER — METOCLOPRAMIDE HCL 5 MG/ML IJ SOLN: 5.0000 mg | Freq: Three times a day (TID) | INTRAMUSCULAR | Status: DC | PRN

## 2014-06-22 MED ORDER — DOCUSATE SODIUM 100 MG PO CAPS
100.0000 mg | ORAL_CAPSULE | Freq: Two times a day (BID) | ORAL | Status: DC
  Administered 2014-06-22 – 2014-06-27 (×10): 100 mg via ORAL
  Filled 2014-06-22 (×12): qty 1

## 2014-06-22 MED ORDER — FENTANYL CITRATE 0.05 MG/ML IJ SOLN
INTRAMUSCULAR | Status: AC
  Administered 2014-06-22: 50 ug via INTRAVENOUS
  Filled 2014-06-22: qty 2

## 2014-06-22 MED ORDER — PROMETHAZINE HCL 25 MG/ML IJ SOLN
INTRAMUSCULAR | Status: AC
  Filled 2014-06-22: qty 1

## 2014-06-22 MED ORDER — SCOPOLAMINE 1 MG/3DAYS TD PT72
MEDICATED_PATCH | TRANSDERMAL | Status: AC
  Filled 2014-06-22: qty 1

## 2014-06-22 MED ORDER — CEFAZOLIN SODIUM 1-5 GM-% IV SOLN
1.0000 g | Freq: Four times a day (QID) | INTRAVENOUS | Status: AC
  Administered 2014-06-23 (×2): 1 g via INTRAVENOUS
  Filled 2014-06-22 (×2): qty 50

## 2014-06-22 MED ORDER — ASPIRIN EC 325 MG PO TBEC
325.0000 mg | DELAYED_RELEASE_TABLET | Freq: Every day | ORAL | Status: DC
  Administered 2014-06-23 – 2014-06-27 (×5): 325 mg via ORAL
  Filled 2014-06-22 (×6): qty 1

## 2014-06-22 MED ORDER — SCOPOLAMINE 1 MG/3DAYS TD PT72
1.0000 | MEDICATED_PATCH | TRANSDERMAL | Status: DC
  Administered 2014-06-22: 1.5 mg via TRANSDERMAL

## 2014-06-22 MED ORDER — HYDROMORPHONE HCL 1 MG/ML IJ SOLN
0.2500 mg | INTRAMUSCULAR | Status: DC | PRN
  Administered 2014-06-22 (×2): 0.5 mg via INTRAVENOUS

## 2014-06-22 MED ORDER — HYDROMORPHONE HCL 1 MG/ML IJ SOLN
INTRAMUSCULAR | Status: AC
  Administered 2014-06-22: 0.5 mg via INTRAVENOUS
  Filled 2014-06-22: qty 2

## 2014-06-22 MED ORDER — ONDANSETRON HCL 4 MG/2ML IJ SOLN: 4.0000 mg | Freq: Four times a day (QID) | INTRAMUSCULAR | Status: DC | PRN

## 2014-06-22 MED ORDER — ACETAMINOPHEN 650 MG RE SUPP: 650.0000 mg | Freq: Four times a day (QID) | RECTAL | Status: DC | PRN

## 2014-06-22 MED ORDER — PROMETHAZINE HCL 25 MG/ML IJ SOLN
6.2500 mg | INTRAMUSCULAR | Status: DC | PRN
  Administered 2014-06-22: 12.5 mg via INTRAVENOUS

## 2014-06-22 SURGICAL SUPPLY — 93 items
APL SKNCLS STERI-STRIP NONHPOA (GAUZE/BANDAGES/DRESSINGS) ×1
BANDAGE ELASTIC 4 VELCRO ST LF (GAUZE/BANDAGES/DRESSINGS) ×2 IMPLANT
BANDAGE ESMARK 6X9 LF (GAUZE/BANDAGES/DRESSINGS) ×1 IMPLANT
BENZOIN TINCTURE PRP APPL 2/3 (GAUZE/BANDAGES/DRESSINGS) ×2 IMPLANT
BLADE SAGITTAL 25.0X1.19X90 (BLADE) ×2 IMPLANT
BLADE SAW SGTL 13X75X1.27 (BLADE) ×2 IMPLANT
BLADE SURG 10 STRL SS (BLADE) ×1 IMPLANT
BLADE SURG 15 STRL LF DISP TIS (BLADE) ×1 IMPLANT
BLADE SURG 15 STRL SS (BLADE) ×2
BNDG CMPR 9X6 STRL LF SNTH (GAUZE/BANDAGES/DRESSINGS) ×1
BNDG CMPR MED 10X6 ELC LF (GAUZE/BANDAGES/DRESSINGS) ×1
BNDG ELASTIC 6X10 VLCR STRL LF (GAUZE/BANDAGES/DRESSINGS) ×2 IMPLANT
BNDG ESMARK 6X9 LF (GAUZE/BANDAGES/DRESSINGS) ×2
BOWL SMART MIX CTS (DISPOSABLE) ×2 IMPLANT
CAP KNEE TOTAL 3 SIGMA ×1 IMPLANT
CATH FOLEY 2WAY SLVR  5CC 16FR (CATHETERS) ×1
CATH FOLEY 2WAY SLVR 5CC 16FR (CATHETERS) ×1 IMPLANT
CEMENT HV SMART SET (Cement) ×4 IMPLANT
CLSR STERI-STRIP ANTIMIC 1/2X4 (GAUZE/BANDAGES/DRESSINGS) ×1 IMPLANT
COVER BACK TABLE 24X17X13 BIG (DRAPES) IMPLANT
COVER SURGICAL LIGHT HANDLE (MISCELLANEOUS) ×2 IMPLANT
CUFF TOURNIQUET SINGLE 34IN LL (TOURNIQUET CUFF) ×2 IMPLANT
CUFF TOURNIQUET SINGLE 44IN (TOURNIQUET CUFF) IMPLANT
DRAPE IMP U-DRAPE 54X76 (DRAPES) ×2 IMPLANT
DRAPE INCISE IOBAN 66X45 STRL (DRAPES) ×1 IMPLANT
DRAPE ORTHO SPLIT 77X108 STRL (DRAPES) ×4
DRAPE SURG ORHT 6 SPLT 77X108 (DRAPES) ×2 IMPLANT
DRAPE U-SHAPE 47X51 STRL (DRAPES) ×1 IMPLANT
DRSG PAD ABDOMINAL 8X10 ST (GAUZE/BANDAGES/DRESSINGS) ×4 IMPLANT
DURAPREP 26ML APPLICATOR (WOUND CARE) ×2 IMPLANT
ELECT REM PT RETURN 9FT ADLT (ELECTROSURGICAL) ×2
ELECTRODE REM PT RTRN 9FT ADLT (ELECTROSURGICAL) ×1 IMPLANT
FACESHIELD WRAPAROUND (MASK) ×4 IMPLANT
FACESHIELD WRAPAROUND OR TEAM (MASK) ×1 IMPLANT
GAUZE SPONGE 2X2 8PLY STRL LF (GAUZE/BANDAGES/DRESSINGS) IMPLANT
GAUZE SPONGE 4X4 12PLY STRL (GAUZE/BANDAGES/DRESSINGS) ×2 IMPLANT
GAUZE XEROFORM 5X9 LF (GAUZE/BANDAGES/DRESSINGS) ×2 IMPLANT
GLOVE BIOGEL PI IND STRL 6.5 (GLOVE) ×4 IMPLANT
GLOVE BIOGEL PI IND STRL 7.5 (GLOVE) ×1 IMPLANT
GLOVE BIOGEL PI IND STRL 8 (GLOVE) ×2 IMPLANT
GLOVE BIOGEL PI INDICATOR 6.5 (GLOVE) ×4
GLOVE BIOGEL PI INDICATOR 7.5 (GLOVE) ×2
GLOVE BIOGEL PI INDICATOR 8 (GLOVE) ×2
GLOVE BIOGEL PI ORTHO PRO SZ7 (GLOVE) ×1
GLOVE ECLIPSE 7.0 STRL STRAW (GLOVE) ×1 IMPLANT
GLOVE ORTHO TXT STRL SZ7.5 (GLOVE) ×4 IMPLANT
GLOVE PI ORTHO PRO STRL SZ7 (GLOVE) IMPLANT
GLOVE SURG SS PI 6.5 STRL IVOR (GLOVE) ×2 IMPLANT
GOWN STRL REUS W/ TWL LRG LVL3 (GOWN DISPOSABLE) ×3 IMPLANT
GOWN STRL REUS W/ TWL XL LVL3 (GOWN DISPOSABLE) IMPLANT
GOWN STRL REUS W/TWL 2XL LVL3 (GOWN DISPOSABLE) ×1 IMPLANT
GOWN STRL REUS W/TWL LRG LVL3 (GOWN DISPOSABLE) ×4
GOWN STRL REUS W/TWL XL LVL3 (GOWN DISPOSABLE)
HANDPIECE INTERPULSE COAX TIP (DISPOSABLE) ×2
IMMOBILIZER KNEE 22 UNIV (SOFTGOODS) ×2 IMPLANT
KIT BASIN OR (CUSTOM PROCEDURE TRAY) ×2 IMPLANT
KIT ROOM TURNOVER OR (KITS) ×2 IMPLANT
MANIFOLD NEPTUNE II (INSTRUMENTS) ×2 IMPLANT
MARKER SPHERE PSV REFLC THRD 5 (MARKER) ×6 IMPLANT
NDL 1/2 CIR MAYO (NEEDLE) ×1 IMPLANT
NEEDLE 1/2 CIR MAYO (NEEDLE) ×2 IMPLANT
NEEDLE HYPO 25GX1X1/2 BEV (NEEDLE) ×2 IMPLANT
NS IRRIG 1000ML POUR BTL (IV SOLUTION) ×2 IMPLANT
PACK TOTAL JOINT (CUSTOM PROCEDURE TRAY) ×2 IMPLANT
PACK UNIVERSAL I (CUSTOM PROCEDURE TRAY) ×2 IMPLANT
PAD ABD 8X10 STRL (GAUZE/BANDAGES/DRESSINGS) ×4 IMPLANT
PAD ARMBOARD 7.5X6 YLW CONV (MISCELLANEOUS) ×4 IMPLANT
PAD CAST 4YDX4 CTTN HI CHSV (CAST SUPPLIES) ×1 IMPLANT
PADDING CAST ABS 4INX4YD NS (CAST SUPPLIES) ×1
PADDING CAST ABS COTTON 4X4 ST (CAST SUPPLIES) IMPLANT
PADDING CAST COTTON 4X4 STRL (CAST SUPPLIES) ×2
PADDING CAST COTTON 6X4 STRL (CAST SUPPLIES) ×3 IMPLANT
PIN SCHANZ 4MM 130MM (PIN) ×8 IMPLANT
SET HNDPC FAN SPRY TIP SCT (DISPOSABLE) ×1 IMPLANT
SPONGE GAUZE 2X2 STER 10/PKG (GAUZE/BANDAGES/DRESSINGS) ×1
SPONGE GAUZE 4X4 12PLY STER LF (GAUZE/BANDAGES/DRESSINGS) ×4 IMPLANT
STAPLER VISISTAT 35W (STAPLE) ×2 IMPLANT
STRIP CLOSURE SKIN 1/2X4 (GAUZE/BANDAGES/DRESSINGS) ×3 IMPLANT
SUCTION FRAZIER TIP 10 FR DISP (SUCTIONS) ×2 IMPLANT
SUT ETHIBOND NAB CT1 #1 30IN (SUTURE) ×3 IMPLANT
SUT ETHILON 3 0 PS 1 (SUTURE) ×1 IMPLANT
SUT VIC AB 2-0 CT1 27 (SUTURE) ×6
SUT VIC AB 2-0 CT1 TAPERPNT 27 (SUTURE) ×2 IMPLANT
SUT VIC AB 3-0 FS2 27 (SUTURE) ×1 IMPLANT
SUT VICRYL 4-0 PS2 18IN ABS (SUTURE) ×2 IMPLANT
SUT VICRYL AB 2 0 TIES (SUTURE) ×2 IMPLANT
SYR CONTROL 10ML LL (SYRINGE) ×2 IMPLANT
TOWEL OR 17X24 6PK STRL BLUE (TOWEL DISPOSABLE) ×2 IMPLANT
TOWEL OR 17X26 10 PK STRL BLUE (TOWEL DISPOSABLE) ×2 IMPLANT
TRAY FOLEY CATH 16FRSI W/METER (SET/KITS/TRAYS/PACK) ×2 IMPLANT
TUBE CONNECTING 12X1/4 (SUCTIONS) ×1 IMPLANT
WATER STERILE IRR 1000ML POUR (IV SOLUTION) ×4 IMPLANT
YANKAUER SUCT BULB TIP NO VENT (SUCTIONS) ×1 IMPLANT

## 2014-06-22 NOTE — Anesthesia Procedure Notes (Addendum)
Anesthesia Regional Block:  Adductor canal block  Pre-Anesthetic Checklist: ,, timeout performed, Correct Patient, Correct Site, Correct Laterality, Correct Procedure, Correct Position, site marked, Risks and benefits discussed, pre-op evaluation,  At surgeon's request and post-op pain management  Laterality: Right  Prep: Maximum Sterile Barrier Precautions used and chloraprep       Needles:  Injection technique: Single-shot  Needle Type: Echogenic Stimulator Needle     Needle Length: 9cm 9 cm Needle Gauge: 22 and 22 G    Additional Needles:  Procedures: ultrasound guided (picture in chart) Adductor canal block Narrative:  Start time: 06/22/2014 2:00 PM End time: 06/22/2014 2:15 PM Injection made incrementally with aspirations every 5 mL.  Performed by: Personally  Anesthesiologist: Gaynelle AduFITZGERALD, WILLIAM E  Additional Notes: 2% Lidocaine skin wheel.    Procedure Name: Intubation Date/Time: 06/22/2014 4:11 PM Performed by: Jerilee HohMUMM, Gustavus Haskin N Pre-anesthesia Checklist: Patient identified, Emergency Drugs available, Suction available and Patient being monitored Patient Re-evaluated:Patient Re-evaluated prior to inductionOxygen Delivery Method: Circle system utilized Preoxygenation: Pre-oxygenation with 100% oxygen Intubation Type: IV induction Ventilation: Mask ventilation without difficulty Laryngoscope Size: Mac and 3 Grade View: Grade I Tube type: Oral Tube size: 7.0 mm Number of attempts: 1 Airway Equipment and Method: Stylet Placement Confirmation: ETT inserted through vocal cords under direct vision,  positive ETCO2 and breath sounds checked- equal and bilateral Secured at: 21 cm Tube secured with: Tape Dental Injury: Teeth and Oropharynx as per pre-operative assessment

## 2014-06-22 NOTE — Transfer of Care (Signed)
Immediate Anesthesia Transfer of Care Note  Patient: Mary Paul  Procedure(s) Performed: Procedure(s): COMPUTER ASSISTED TOTAL KNEE ARTHROPLASTY (Right)  Patient Location: PACU  Anesthesia Type:GA combined with regional for post-op pain  Level of Consciousness: awake  Airway & Oxygen Therapy: Patient Spontanous Breathing and Patient connected to face mask oxygen  Post-op Assessment: Report given to PACU RN and Post -op Vital signs reviewed and stable  Post vital signs: Reviewed and stable  Complications: No apparent anesthesia complications

## 2014-06-22 NOTE — Interval H&P Note (Signed)
History and Physical Interval Note:  06/22/2014 3:11 PM  Mary Paul  has presented today for surgery, with the diagnosis of Osteoarthritis Right Knee  The various methods of treatment have been discussed with the patient and family. After consideration of risks, benefits and other options for treatment, the patient has consented to  Procedure(s): COMPUTER ASSISTED TOTAL KNEE ARTHROPLASTY (Right) as a surgical intervention .  The patient's history has been reviewed, patient examined, no change in status, stable for surgery.  I have reviewed the patient's chart and labs.  Questions were answered to the patient's satisfaction.     Sabena Winner C

## 2014-06-22 NOTE — Anesthesia Preprocedure Evaluation (Addendum)
Anesthesia Evaluation  Patient identified by MRN, date of birth, ID band Patient awake    Reviewed: Allergy & Precautions, H&P , NPO status , Patient's Chart, lab work & pertinent test results, reviewed documented beta blocker date and time   History of Anesthesia Complications (+) PONV  Airway Mallampati: II  TM Distance: >3 FB Neck ROM: Full    Dental no notable dental hx. (+) Teeth Intact, Dental Advisory Given   Pulmonary sleep apnea ,  breath sounds clear to auscultation  Pulmonary exam normal       Cardiovascular negative cardio ROS  Rhythm:Regular Rate:Normal  Normal STRESS 06/16/2014, EKG 2015 normal, cleared by cardio   Neuro/Psych Anxiety Depression negative neurological ROS     GI/Hepatic Neg liver ROS, GERD-  Medicated and Controlled,  Endo/Other  Hypothyroidism (replacement) Morbid obesity  Renal/GU negative Renal ROS  negative genitourinary   Musculoskeletal  (+) Arthritis -, Osteoarthritis,    Abdominal   Peds  Hematology negative hematology ROS (+)   Anesthesia Other Findings   Reproductive/Obstetrics negative OB ROS                            Anesthesia Physical Anesthesia Plan  ASA: III  Anesthesia Plan: General and Regional   Post-op Pain Management:    Induction: Intravenous  Airway Management Planned: Oral ETT  Additional Equipment:   Intra-op Plan:   Post-operative Plan: Extubation in OR  Informed Consent: I have reviewed the patients History and Physical, chart, labs and discussed the procedure including the risks, benefits and alternatives for the proposed anesthesia with the patient or authorized representative who has indicated his/her understanding and acceptance.   Dental advisory given  Plan Discussed with: CRNA  Anesthesia Plan Comments:       Anesthesia Quick Evaluation

## 2014-06-22 NOTE — Anesthesia Postprocedure Evaluation (Signed)
  Anesthesia Post-op Note  Patient: Mary Paul  Procedure(s) Performed: Procedure(s): COMPUTER ASSISTED TOTAL KNEE ARTHROPLASTY (Right)  Patient Location: PACU  Anesthesia Type:General and GA combined with regional for post-op pain  Level of Consciousness: awake, alert , oriented and patient cooperative  Airway and Oxygen Therapy: Patient Spontanous Breathing  Post-op Pain: moderate  Post-op Assessment: Post-op Vital signs reviewed, Patient's Cardiovascular Status Stable, Respiratory Function Stable, Patent Airway, No signs of Nausea or vomiting and Pain level controlled  Post-op Vital Signs: stable  Last Vitals:  Filed Vitals:   06/22/14 1900  BP: 110/67  Pulse: 84  Temp: 36.4 C  Resp: 16    Complications: No apparent anesthesia complications

## 2014-06-22 NOTE — H&P (Signed)
  [facility]   [facility]   [address], [place], [place] [address] Telephone: [phone]  Fax: [phone]     PATIENT: Mary Paul, Mary Paul   MR#: [id number]  DOB: 10/16/1970   Visit Date: 05/26/2014     A 43 year old female returns and states, "I am ready for my right knee arthroplasty."  Patient cannot get up without using her arms.  She has been ambulatory with a cane or crutches; cannot walk without them.  She works as an Cytogeneticistadministrator/social worker in a facility.  The pain ranges between 7/10 and 10/10.  She had a previous Mary Paul procedure done by Dr. Arletha GrippePlomaritis 14 years ago in 2003.  She has had bone-on-bone changes, no joint space, large osteophytes.  Has failed anti-inflammatories and intra-articular injections. She has difficulty sleeping.   CURRENT MEDICATIONS:  Includes Prozac 40 mg daily, BuSpar 1 pill q. a.m. and 2 tablets p.o. q. p.m., generic Lipitor 10 mg, 0.075 mg Synthroid, clonazepam p.r.n. sleep.  She has been on meloxicam.  She has had Aleve, also Advil.   ALLERGIES:  None.   FAMILY HISTORY:  Positive for diabetes and cancer.   SOCIAL HISTORY:  Patient is married to her husband, Mary Paul.  She does not smoke, occasionally drinks mostly on holidays.    FOURTEEN-POINT REVIEW OF SYSTEMS:  Positive for anxiety, depression, acid reflux, severe knee arthritis, bronchitis, migraines, sleep apnea, and thyroid condition.   PHYSICAL EXAMINATION:  Patient is 5 feet 2 inches, increased BMI, weight 228.  BP 132/78.  Extraocular movements intact.  Lungs:  Clear.  Heart:  Regular rate and rhythm.  No synovitis in upper extremities, fingers, wrists, or elbows.  Good range of motion of her shoulders.  Spine is straight.  Hip range of motion is normal.  Distal pulses are 2+.  No sciatic notch tenderness.  She has a 14-degree varus deformity, 1 to 2 cm osteophytes, no joint space medially.  Opposite knee shows bone-on-bone changes in  medial compartment, lateral osteophytes without varus deformity.  Patellofemoral degenerative spurring.   ASSESSMENT/PLAN:  End-stage osteoarthritis.  She has had multiple anti-inflammatories, cannot walk without a cane.  She has flexion to 110 degrees but has a 35-degree flexion contracture.  She does not reach full extension.  She has been ambulatory with a bent-knee gait.  I discussed with her that a brace does not have anything to offer her at this point, neither does therapy.  Total knee arthroplasty is recommended for end-stage disease with progressive varus deformity and flexion deformity.  We have talked with her about this in the past, and I discussed that surgery was indicated back in 2014.  Patient states she would like to proceed.  All questions answered.  Two-night stay in the hospital, home physical therapy, and outpatient therapy discussed.  She understands she will have trouble working on getting full extension due to the length of time she has had the flexion contracture.    For additional information please see handwritten notes, reports, orders and prescriptions in this chart.      Mark C. Ophelia CharterYates, M.D.    Auto-Authenticated by Veverly FellsMark C. Ophelia CharterYates, M.D.  MCY/lm/af DD: 05/26/2014  DT: 05/27/2014   cc: Lottie RaterMark Meijer, MD

## 2014-06-23 ENCOUNTER — Encounter (HOSPITAL_COMMUNITY): Payer: Self-pay | Admitting: Orthopaedic Surgery

## 2014-06-23 DIAGNOSIS — M179 Osteoarthritis of knee, unspecified: Secondary | ICD-10-CM | POA: Diagnosis present

## 2014-06-23 DIAGNOSIS — M1710 Unilateral primary osteoarthritis, unspecified knee: Secondary | ICD-10-CM | POA: Diagnosis present

## 2014-06-23 LAB — CBC
HCT: 31.4 % — ABNORMAL LOW (ref 36.0–46.0)
Hemoglobin: 10.3 g/dL — ABNORMAL LOW (ref 12.0–15.0)
MCH: 27.3 pg (ref 26.0–34.0)
MCHC: 32.8 g/dL (ref 30.0–36.0)
MCV: 83.3 fL (ref 78.0–100.0)
Platelets: 208 10*3/uL (ref 150–400)
RBC: 3.77 MIL/uL — ABNORMAL LOW (ref 3.87–5.11)
RDW: 13.3 % (ref 11.5–15.5)
WBC: 10.5 10*3/uL (ref 4.0–10.5)

## 2014-06-23 LAB — BASIC METABOLIC PANEL
Anion gap: 3 — ABNORMAL LOW (ref 5–15)
BUN: 7 mg/dL (ref 6–23)
CO2: 29 mmol/L (ref 19–32)
Calcium: 8.7 mg/dL (ref 8.4–10.5)
Chloride: 106 meq/L (ref 96–112)
Creatinine, Ser: 1.04 mg/dL (ref 0.50–1.10)
GFR calc Af Amer: 75 mL/min — ABNORMAL LOW (ref >90)
GFR calc non Af Amer: 64 mL/min — ABNORMAL LOW (ref >90)
Glucose, Bld: 144 mg/dL — ABNORMAL HIGH (ref 70–99)
Potassium: 4.8 mmol/L (ref 3.5–5.1)
Sodium: 138 mmol/L (ref 135–145)

## 2014-06-23 MED ORDER — METHOCARBAMOL 500 MG PO TABS: 500.0000 mg | ORAL_TABLET | Freq: Four times a day (QID) | ORAL | Status: DC | PRN

## 2014-06-23 MED ORDER — DIPHENHYDRAMINE HCL 12.5 MG/5ML PO ELIX
25.0000 mg | ORAL_SOLUTION | Freq: Four times a day (QID) | ORAL | Status: DC | PRN
  Administered 2014-06-23 – 2014-06-24 (×2): 25 mg via ORAL
  Filled 2014-06-23 (×2): qty 10

## 2014-06-23 MED ORDER — OXYCODONE-ACETAMINOPHEN 10-325 MG PO TABS: 1.0000 | ORAL_TABLET | Freq: Four times a day (QID) | ORAL | Status: DC | PRN

## 2014-06-23 MED ORDER — FLUOXETINE HCL 20 MG PO CAPS
60.0000 mg | ORAL_CAPSULE | Freq: Every day | ORAL | Status: DC
  Administered 2014-06-23 – 2014-06-26 (×4): 60 mg via ORAL
  Filled 2014-06-23 (×5): qty 3

## 2014-06-23 MED ORDER — BUSPIRONE HCL 10 MG PO TABS
10.0000 mg | ORAL_TABLET | Freq: Three times a day (TID) | ORAL | Status: DC
  Administered 2014-06-23 – 2014-06-27 (×12): 10 mg via ORAL
  Filled 2014-06-23 (×13): qty 1

## 2014-06-23 MED ORDER — ONDANSETRON HCL 4 MG PO TABS: 4.0000 mg | ORAL_TABLET | Freq: Three times a day (TID) | ORAL | Status: DC | PRN

## 2014-06-23 MED ORDER — HYDROMORPHONE HCL 1 MG/ML IJ SOLN
INTRAMUSCULAR | Status: AC
  Filled 2014-06-23: qty 1

## 2014-06-23 MED ORDER — HYDROMORPHONE HCL 1 MG/ML IJ SOLN
1.0000 mg | INTRAMUSCULAR | Status: DC | PRN
  Administered 2014-06-23 – 2014-06-26 (×18): 1 mg via INTRAVENOUS
  Filled 2014-06-23 (×19): qty 1

## 2014-06-23 MED ORDER — ASPIRIN EC 325 MG PO TBEC: 325.0000 mg | DELAYED_RELEASE_TABLET | Freq: Every day | ORAL | Status: DC

## 2014-06-23 MED ORDER — LEVOTHYROXINE SODIUM 150 MCG PO TABS
150.0000 ug | ORAL_TABLET | Freq: Every day | ORAL | Status: DC
  Administered 2014-06-24 – 2014-06-27 (×4): 150 ug via ORAL
  Filled 2014-06-23 (×6): qty 1

## 2014-06-23 MED ORDER — CLONAZEPAM 1 MG PO TABS
1.0000 mg | ORAL_TABLET | Freq: Every day | ORAL | Status: DC
  Administered 2014-06-23 – 2014-06-26 (×4): 1 mg via ORAL
  Filled 2014-06-23 (×4): qty 1

## 2014-06-23 NOTE — Progress Notes (Signed)
The patient was c/o of severe pain in the right knee after all pain medications were given. After talking to the patient, it was discovered that none of her home anxiety medications had be ordered in the hospital; Buspar, Klonopin, Prozac. Dr. Lajoyce Cornersuda was on-call and made aware. Orders received to restart her medications. The patient stated she felt most of her discomfort was from anxiety and she was very glad to get back on her medication schedule.

## 2014-06-23 NOTE — Evaluation (Signed)
Occupational Therapy Evaluation Patient Details Name: Mary Paul MRN: [id number] DOB: 05/24/1971 Today's Date: 06/23/2014    History of Present Illness 43 [facility].o. [facility] for elective R [facility]KA.  Pt with significant PMHx of anxiety, head injury, depression, and frequent urination.   Clinical Impression   Pt was functioning at a modified independent level prior to admission.  She presents with R LE pain with decreased ability to WB and heavy reliance on her arms for sit to stand and on walker.  Pt needs assist for LB ADL and all ADL transfers.  She has concerns with going home as her support there is limited, wants to pursue SNF for ST rehab.  Will follow acutely.    Follow Up Recommendations  SNF    Equipment Recommendations  3 in 1 bedside comode    Recommendations for Other Services       Precautions / Restrictions Precautions Precautions: Knee;Fall Precaution Comments: WBAT status reviewed with pt, no pillow under knee Required Braces or Orthoses: Knee Immobilizer - Right Knee Immobilizer - Right: On when out of bed or walking Restrictions Weight Bearing Restrictions: Yes RLE Weight Bearing: Weight bearing as tolerated      Mobility Bed Mobility     General bed mobility comments: pt up in chair  Transfers Overall transfer level: Needs assistance Equipment used: Rolling walker (2 wheeled) Transfers: Sit to/from [employer] CorporationStand;Stand Pivot Transfers Sit to Stand: Min assist Stand pivot transfers: Min assist       General transfer comment: verbal cues to align her body with chair before sitting and in hand and R LE placement. Pt heavily reliant on hands    Balance Overall balance assessment: Needs assistance Sitting-balance support: Feet supported;No upper extremity supported Sitting balance-Leahy Scale: Good     Standing balance support: Bilateral upper extremity supported Standing balance-Leahy Scale: Poor                             ADL Overall ADL's : Needs assistance/impaired Eating/Feeding: Independent;Sitting   Grooming: Wash/dry hands;Wash/dry face;Oral care;Set up;Sitting   Upper Body Bathing: Set up;Sitting   Lower Body Bathing: Moderate assistance;Sit to/from stand   Upper Body Dressing : Set up;Sitting   Lower Body Dressing: Moderate assistance;Sit to/from stand   Toilet Transfer: Minimal assistance;Stand-pivot;BSC;RW   Toileting- Clothing Manipulation and Hygiene: Minimal assistance;Sit to/from stand;Sitting/lateral lean       Functional mobility during ADLs: Minimal assistance;Rolling walker General ADL Comments: Began education in availability and use of 3 in 1 as shower seat and to elevate toilet and in AE for LB bathing and dressing.     Vision                     Perception     Praxis      Pertinent Vitals/Pain Pain Assessment: 0-10 Pain Score: 8  Pain Location: R knee Pain Descriptors / Indicators: Aching Pain Intervention(s): Limited activity within patient's tolerance;Monitored during session;RN gave pain meds during session;Repositioned;Ice applied     Hand Dominance Right   Extremity/Trunk Assessment Upper Extremity Assessment Upper Extremity Assessment:  (Pt with complaints of soreness in UEs with walker use)   Lower Extremity Assessment Lower Extremity Assessment: Defer to PT evaluation RLE Deficits / Details: right leg with normal post op pain and weakness.  Pt with 3/5 ankle, 2/5 knee, 2+/5 hip flexion.    Cervical / Trunk Assessment Cervical / Trunk Assessment: Normal  Communication Communication Communication: No difficulties   Cognition Arousal/Alertness: Awake/alert Behavior During Therapy: WFL for tasks assessed/performed Overall Cognitive Status: Within Functional Limits for tasks assessed                     General Comments       Exercises      Shoulder Instructions      Home Living Family/patient expects to be discharged  to:: Skilled nursing facility Living Arrangements: Spouse/significant other;Children Available Help at Discharge: Family;Available PRN/intermittently (husband travels, children will be in school ) Type of Home: House Home Access: Level entry     Home Layout: One level     Bathroom Shower/Tub: Producer, television/film/videoWalk-in shower   Bathroom Toilet: Standard     Home Equipment: Cane - quad   Additional Comments: Pt has very little support at home (from teenagers when not at school) and is fearful of tripping over her three small dogs.        Prior Functioning/Environment Level of Independence: Independent with assistive device(s)        Comments: used cane for gait PTA, still working, Secretary/administratordirector of mental health program, driving.     OT Diagnosis: Generalized weakness;Acute pain   OT Problem List: Decreased strength;Decreased activity tolerance;Impaired balance (sitting and/or standing);Decreased knowledge of use of DME or AE;Obesity;Impaired UE functional use;Pain   OT Treatment/Interventions: Self-care/ADL training;DME and/or AE instruction;Therapeutic activities;Balance training;Patient/family education    OT Goals(Current goals can be found in the care plan section) Acute Rehab OT Goals Patient Stated Goal: to go to rehab first, then go home once she is more independent.  OT Goal Formulation: With patient Time For Goal Achievement: 06/30/14 Potential to Achieve Goals: Good ADL Goals Pt Will Perform Grooming: with supervision;standing Pt Will Perform Lower Body Bathing: with supervision;with adaptive equipment;sit to/from stand Pt Will Perform Lower Body Dressing: with supervision;with adaptive equipment;sit to/from stand Pt Will Transfer to Toilet: with supervision;ambulating;bedside commode (over toilet) Pt Will Perform Toileting - Clothing Manipulation and hygiene: with supervision;sit to/from stand Pt Will Perform Tub/Shower Transfer: Shower transfer;with min guard assist;rolling  walker;ambulating;3 in 1  OT Frequency: Min 2X/week   Barriers to D/C: Decreased caregiver support          Co-evaluation              End of Session Equipment Utilized During Treatment: Rolling walker;Gait belt;Right knee immobilizer  Activity Tolerance: Patient limited by pain Patient left: in chair;with call bell/phone within reach   Time: 1134-1210 OT Time Calculation (min): 36 min Charges:  OT General Charges $OT Visit: 1 Procedure OT Evaluation $Initial OT Evaluation Tier I: 1 Procedure OT Treatments $Self Care/Home Management : 8-22 mins G-Codes:    Evern BioMayberry, Kosisochukwu [place] Lynn 06/23/2014, 12:21 PM  [phone]

## 2014-06-23 NOTE — Progress Notes (Signed)
Subjective: Doing well.  Pain controlled.    Objective: Vital signs in last 24 hours: Temp:  [97.5 F (36.4 C)-98 F (36.7 C)] 98 F (36.7 C) (12/31 0453) Pulse Rate:  [71-89] 86 (12/31 0453) Resp:  [9-20] 16 (12/31 0751) BP: (87-125)/(23-89) 96/50 mmHg (12/31 0453) SpO2:  [93 %-100 %] 100 % (12/31 0453) Weight:  [117.935 kg (260 lb)] 117.935 kg (260 lb) (12/30 1312)  Intake/Output from previous day: 12/30 0701 - 12/31 0700 In: 2000 [I.V.:2000] Out: 495 [Urine:445; Blood:50] Intake/Output this shift:     Recent Labs  06/23/14 0533  HGB 10.3*    Recent Labs  06/23/14 0533  WBC 10.5  RBC 3.77*  HCT 31.4*  PLT 208    Recent Labs  06/23/14 0533  NA 138  K 4.8  CL 106  CO2 29  BUN 7  CREATININE 1.04  GLUCOSE 144*  CALCIUM 8.7   No results for input(s): LABPT, INR in the last 72 hours.  Exam:  Dressing C/D/I.  Calf nontender, nvi.    Assessment/Plan: Start PT today.  Anticipate d/c home tomorrow if does well.     Mary Paul M 06/23/2014, 8:27 AM

## 2014-06-23 NOTE — Evaluation (Signed)
Physical Therapy Evaluation Patient Details Name: Merwyn Katosmy Ringle MRN: [id number] DOB: 02/01/1971 Today's Date: 06/23/2014   History of Present Illness  43 y.o. [facility] for elective R TKA.  Pt with significant PMHx of anxiety, head injury, depression, and frequent urination.  Clinical Impression  Pt is POD #1 and is moving slowly.  She is self limiting due to pain and hopping instead of stepping during gait with RW.  Pt would benefit from SNF stay for rehab prior to returning home as she has little to no support at discharge.   PT to follow acutely for deficits listed below.       Follow Up Recommendations SNF    Equipment Recommendations  Rolling walker with 5" wheels    Recommendations for Other Services   NA    Precautions / Restrictions Precautions Precautions: Knee;Fall Precaution Comments: WBAT status reviewed with pt Required Braces or Orthoses: Knee Immobilizer - Right (no order for one, but one in room.  Used for first time up) Knee Immobilizer - Right: On when out of bed or walking Restrictions RLE Weight Bearing: Weight bearing as tolerated      Mobility  Bed Mobility Overal bed mobility: Needs Assistance Bed Mobility: Supine to Sit     Supine to sit: Min assist     General bed mobility comments: Min assist to help progress her right leg over EOB.  Pt using bed rail for leverage to get trunk to sitting.  Lightheaded initially upon sitting.   Transfers Overall transfer level: Needs assistance Equipment used: Rolling walker (2 wheeled) Transfers: Sit to/from Stand Sit to Stand: Min assist         General transfer comment: Min assist from bed and toilet.  Heavy reliance on upper extremity support and multiple attempts needed for initial stand due to pain in right leg with WB.  Verbal cues for safe hand placement and safety when going to sit as pt wants to sit prematurely and "dive" towards sitting surface.    Ambulation/Gait Ambulation/Gait assistance: Min assist Ambulation Distance (Feet): 15 Feet Assistive device: Rolling walker (2 wheeled) Gait Pattern/deviations: Step-to pattern;Antalgic;Trunk flexed Gait velocity: decreased Gait velocity interpretation: Below normal speed for age/gender General Gait Details: Pt preferring to hop to gait pattern today due to pain with WB on right leg.  She is very unsteady with this technique and therapist not only has to stabilize her trunk, but the RW to prevent falls and having to walker roll out of control while hopping.  Encouraged at least foot flat for balance.  Verbal cues for LE sequencing and safe RW use.  Pt is also very limited by poor upper extremity endurance.          Balance Overall balance assessment: Needs assistance Sitting-balance support: Feet supported;No upper extremity supported Sitting balance-Leahy Scale: Good     Standing balance support: Bilateral upper extremity supported Standing balance-Leahy Scale: Poor                               Pertinent Vitals/Pain Pain Assessment: 0-10 Pain Score: 7  Pain Location: right knee Pain Descriptors / Indicators: Aching;Burning Pain Intervention(s): Limited activity within patient's tolerance;Premedicated before session;Monitored during session;Repositioned    Home Living Family/patient expects to be discharged to:: Skilled nursing facility (wants to pursue SNF) Living Arrangements: Spouse/significant other;Children Available Help at Discharge: Family;Available PRN/intermittently (husband works out of town, children in school) Type of Home:  House Home Access: Level entry (level entry at the back door)     Home Layout: One level Home Equipment: Cane - quad Additional Comments: Pt has very little support at home (from teenagers when not at school) and is fearful of tripping over her three small dogs.      Prior Function Level of Independence: Independent with  assistive device(s)         Comments: used cane for gait PTA, still working, Secretary/administratordirector of mental health program, driving.      Hand Dominance   Dominant Hand: Right    Extremity/Trunk Assessment   Upper Extremity Assessment: Defer to OT evaluation           Lower Extremity Assessment: RLE deficits/detail RLE Deficits / Details: right leg with normal post op pain and weakness.  Pt with 3/5 ankle, 2/5 knee, 2+/5 hip flexion.     Cervical / Trunk Assessment: Normal  Communication   Communication: No difficulties  Cognition Arousal/Alertness: Awake/alert Behavior During Therapy: WFL for tasks assessed/performed Overall Cognitive Status: Within Functional Limits for tasks assessed                         Exercises Total Joint Exercises Ankle Circles/Pumps: AROM;Both;20 reps;Seated      Assessment/Plan    PT Assessment Patient needs continued PT services  PT Diagnosis Difficulty walking;Abnormality of gait;Generalized weakness;Acute pain   PT Problem List Decreased strength;Decreased range of motion;Decreased activity tolerance;Decreased balance;Decreased mobility;Decreased knowledge of use of DME;Decreased knowledge of precautions;Obesity;Pain  PT Treatment Interventions DME instruction;Gait training;Stair training;Functional mobility training;Therapeutic activities;Therapeutic exercise;Balance training;Neuromuscular re-education;Patient/family education;Manual techniques;Modalities   PT Goals (Current goals can be found in the Care Plan section) Acute Rehab PT Goals Patient Stated Goal: to go to rehab first, then go home once she is more independent.  PT Goal Formulation: With patient Time For Goal Achievement: 06/30/14 Potential to Achieve Goals: Good    Frequency 7X/week   Barriers to discharge Decreased caregiver support does not have 24/7 support and have three small dogs which are a tripping hazard.        End of Session Equipment Utilized During  Treatment: Right knee immobilizer Activity Tolerance: Patient limited by fatigue;Patient limited by pain Patient left: in chair;with call bell/phone within reach Nurse Communication: Mobility status        Time: [id number] PT Time Calculation (min) (ACUTE ONLY): 26 min   Charges:   PT Evaluation $Initial PT Evaluation Tier I: 1 Procedure PT Treatments $Therapeutic Activity: 8-22 mins    Alainna Stawicki B. Yulitza Shorts, PT, DPT [phone]   06/23/2014, 11:18 AM

## 2014-06-23 NOTE — Progress Notes (Signed)
Utilization review completed.  

## 2014-06-23 NOTE — Progress Notes (Signed)
Orthopedic Tech Progress Note Patient Details:  Mary Paul 05/20/1971 [id number]  Patient ID: Mary Paul, female   DOB: 11/10/1970, 43 y.o.   MRN: [id number] Placed pt's rle in cpm @ 0-60 degrees @1015   Nikki DomCrawford, Adela Esteban 06/23/2014, 10:15 AM

## 2014-06-23 NOTE — Progress Notes (Signed)
Physical Therapy Treatment Patient Details Name: Mary Paul MRN: [id number] DOB: 02/27/1971 Today's Date: 06/23/2014    History of Present Illness 43 [facility].o. [facility] for elective R TKA.  Pt with significant PMHx of anxiety, head injury, depression, and frequent urination.    PT Comments    Pt progressing towards physical therapy goals. Ambulation was limited by fatigue and wrist pain from IV site while weight bearing on the RW. HEP was issued and reps/sets/frequency were discussed. AAROM was provided to complete exercises. Will continue to follow and progress as able per POC.  Follow Up Recommendations  SNF     Equipment Recommendations  Rolling walker with 5" wheels    Recommendations for Other Services       Precautions / Restrictions Precautions Precautions: Knee;Fall Precaution Comments: WBAT status reviewed with pt, no pillow under knee Required Braces or Orthoses: Knee Immobilizer - Right Knee Immobilizer - Right: On when out of bed or walking Restrictions Weight Bearing Restrictions: Yes RLE Weight Bearing: Weight bearing as tolerated    Mobility  Bed Mobility Overal bed mobility: Needs Assistance Bed Mobility: Sit to Supine       Sit to supine: Min assist   General bed mobility comments: Pt required assist to elevate LE's back into bed. Pt able to scoot and reposition self well.   Transfers Overall transfer level: Needs assistance Equipment used: Rolling walker (2 wheeled) Transfers: Sit to/from [employer]GI CorporationStand;Stand Pivot Transfers Sit to Stand: Min guard Stand pivot transfers: Min guard       General transfer comment: VC's for hand placement on seated surface for safety. Pt was able to power-up to full stand and take pivotal steps [facility]   Ambulation/Gait             General Gait Details: Deferred due to pt fatigue and pain from IV site while using the RW.    Stairs            Wheelchair  Mobility    Modified Rankin (Stroke Patients Only)       Balance Overall balance assessment: Needs assistance Sitting-balance support: Feet supported;No upper extremity supported Sitting balance-Leahy Scale: Good     Standing balance support: Bilateral upper extremity supported Standing balance-Leahy Scale: Poor Standing balance comment: Pt requires UE support at this time to maintain standing balance.                     Cognition Arousal/Alertness: Awake/alert Behavior During Therapy: WFL for tasks assessed/performed Overall Cognitive Status: Within Functional Limits for tasks assessed                      Exercises Total Joint Exercises Ankle Circles/Pumps: 20 reps Quad Sets: 10 reps Gluteal Sets: 10 reps Short Arc Quad: 10 reps;AAROM Heel Slides: 10 reps;AAROM Hip ABduction/ADduction: 10 reps;AAROM    General Comments        Pertinent Vitals/Pain Pain Assessment: 0-10 Pain Score: 7  Pain Location: R knee Pain Intervention(s): Limited activity within patient's tolerance;Monitored during session    Home Living                      Prior Function            PT Goals (current goals can now be found in the care plan section) Acute Rehab PT Goals Patient Stated Goal: to go to rehab first, then go home once she is  more independent.  PT Goal Formulation: With patient Time For Goal Achievement: 06/30/14 Potential to Achieve Goals: Good Progress towards PT goals: Progressing toward goals    Frequency  7X/week    PT Plan Current plan remains appropriate    Co-evaluation             End of Session   Activity Tolerance: Patient limited by fatigue Patient left: in bed;with call bell/phone within reach;with family/visitor present     Time: [id number] PT Time Calculation (min) (ACUTE ONLY): 25 min  Charges:  $Therapeutic Exercise: 8-22 mins $Therapeutic Activity: 8-22 mins                    G Codes:      Mary SlipperKirkman,  Mary Paul 06/23/2014, 4:55 PM   Mary SlipperLaura Shereece Paul, PT, DPT Acute Rehabilitation Services Pager: [phone]

## 2014-06-23 NOTE — Op Note (Signed)
NAMMerwyn Katos:  Hochstein, Kinda             ACCOUNT NO.:  [id number]  MEDICAL RECORD NO.:  [id number]  LOCATION:  [id number]                        FACILITY:  [facility]  PHYSICIAN:  Londynn Sonoda C. Ophelia CharterYates, M.D.    DATE OF BIRTH:  [birth year]  DATE OF PROCEDURE:  06/22/2014 DATE OF DISCHARGE:                              OPERATIVE REPORT   PREOPERATIVE DIAGNOSIS:  Right knee osteoarthritis with flexion contracture, varus deformity.  POSTOPERATIVE DIAGNOSIS:  Right knee osteoarthritis with flexion contracture, varus deformity.  PROCEDURE:  Right cemented total knee arthroplasty, computer assisted.  SURGEON:  Shuan Statzer C. Ophelia CharterYates, M.D.  ASSISTANT:  Genene ChurnJames M. Denton Meekwens, P.A., medically necessary and present for the entire procedure.  ANESTHESIA:  General plus preoperative block.  TOURNIQUET TIME:  1 hour and 17 minutes.  DRAINS:  None.  DESCRIPTION OF PROCEDURE:  After induction of general anesthesia, preoperative Ancef prophylaxis, standard prepping and draping.  The patient had an old incision from [place] procedure and had screw with nut present, blocking the tibial canal.  Computer-assisted navigation was used since the intramedullary alignment would be blocked by the screw. Old incision was marked with a sterile skin marker after prepping and draping.  Betadine Steri-Drape was applied.  Time-out procedure completed.  Leg was wrapped in Esmarch, tourniquet inflated.  Midline incision was made.  Superficial retinaculum was divided and deep retinaculum was divided with a medial parapatellar incision.  Meniscal remnant laterally was resected.  There is partial discoid meniscus. Large osteophytes, severe subchondral wear.  Large posterior spurs were removed.  ACL, PCL were resected.  Stab incision was made mid tibia and then 2 pins were placed inside.  The incision in the distal femur and computer models were generated tibia and femur.  Femur size was 3.  10 mm was taken off tibia and femur due to the flexion  contracture. Chamfer cuts, box cuts were made.  Tibia was a 2.5.  Carolin GuernseyKeel was prepared. All cuts were less than 1 mm off or less than a degree off.  There was significant spurring posteriorly and MCL had to be stripped off the tibia due to the flexion contracture.  Posterior medial corner had to be released.  Posterior capsule had to be released in order to get the knee out in full extension from the 30 degree flexion contracture.  Trials were inserted.  Posterior spurs removed, 3/4 curved osteotome.  There was large chunks of bone in the midline and posterior medial which were removed.  Pulsatile lavage vacuum mixing of the cement.  Tibia cemented first followed by femur, placement of poly, 10 mm rotating platform [employer].  35 mm, 3 pegged patella.  Lugs were present on the femur. Knee reached at full extension.  Varus was corrected.  Knee was straight.  Collateral ligaments were stable and well balanced after release of the medial collateral ligament and the posterior medial corner.  Cement was hardened for 15 minutes.  All excessive cement had been removed.  Standard layered closure, postoperative dressing, knee immobilizer, and transferred to the recovery room.     Wilmont Olund C. Ophelia CharterYates, M.D.     MCY/MEDQ  D:  06/22/2014  T:  06/23/2014  Job:  [id number]

## 2014-06-24 LAB — CBC
HCT: 29 % — ABNORMAL LOW (ref 36.0–46.0)
Hemoglobin: 9.5 g/dL — ABNORMAL LOW (ref 12.0–15.0)
MCH: 28.1 pg (ref 26.0–34.0)
MCHC: 32.8 g/dL (ref 30.0–36.0)
MCV: 85.8 fL (ref 78.0–100.0)
Platelets: 191 10*3/uL (ref 150–400)
RBC: 3.38 MIL/uL — ABNORMAL LOW (ref 3.87–5.11)
RDW: 13.7 % (ref 11.5–15.5)
WBC: 6.1 10*3/uL (ref 4.0–10.5)

## 2014-06-24 NOTE — Progress Notes (Signed)
Physical Therapy Treatment Patient Details Name: Mary Paul MRN: [id number] DOB: [birth year] Today's Date: 06/24/2014    History of Present Illness 44 y.o. female admitted to [facility] on 07/23/13 for elective R TKA.  Pt with significant PMHx of anxiety, head injury, depression, and frequent urination.    PT Comments    Pt is POD #2 and is not progressing well due to 10/10 pain in her right leg.  She is very guarded with exercises and is not putting weight down on her leg during transfers.  We were unable to walk today.  RN made aware.  Pt continues to be appropriate for SNF level rehab at discharge.  PT will follow acutely.   Follow Up Recommendations  SNF     Equipment Recommendations  Rolling walker with 5" wheels    Recommendations for Other Services   NA     Precautions / Restrictions Precautions Precautions: Knee;Fall Precaution Comments: WBAT status reviewed with pt, no pillow under knee Required Braces or Orthoses: Knee Immobilizer - Right Knee Immobilizer - Right: On when out of bed or walking Restrictions RLE Weight Bearing: Weight bearing as tolerated    Mobility  Bed Mobility Overal bed mobility: Needs Assistance Bed Mobility: Sit to Supine       Sit to supine: Min assist   General bed mobility comments: Pt needed min assist to support leg to lift it back into the bed from sitting.   Transfers Overall transfer level: Needs assistance Equipment used: Rolling walker (2 wheeled) Transfers: Sit to/from Stand Sit to Stand: Min assist Stand pivot transfers: Min assist       General transfer comment: Min assist to support trunk when going to stand.  Pt not putting any weight on her right foot during transitions and standing.  Stand pivot with RW min assist to steady pt at trunk, stabilize RW, verbal cues for safety to avoid premature sit.   Ambulation/Gait             General Gait Details: Unable today due to pain in right leg.           Balance  Overall balance assessment: Needs assistance Sitting-balance support: Feet supported;No upper extremity supported Sitting balance-Leahy Scale: Good     Standing balance support: Bilateral upper extremity supported Standing balance-Leahy Scale: Poor                      Cognition Arousal/Alertness: Awake/alert Behavior During Therapy: Anxious Overall Cognitive Status: Within Functional Limits for tasks assessed                      Exercises Total Joint Exercises Ankle Circles/Pumps: AROM;Both;20 reps;Supine Quad Sets: AROM;Right;10 reps;Supine Towel Squeeze: AAROM;Both;10 reps;Supine Heel Slides: AAROM;Right;10 reps;Supine Goniometric ROM: 12-20 (very limited and guarded, refused CPM last night. )        Pertinent Vitals/Pain Pain Assessment: 0-10 Pain Score: 10-Worst pain ever Pain Location: right knee, hip, low back, down to her foot Pain Descriptors / Indicators: Aching;Burning;Constant Pain Intervention(s): Limited activity within patient's tolerance;Monitored during session;Repositioned;Patient requesting pain meds-RN notified           PT Goals (current goals can now be found in the care plan section) Acute Rehab PT Goals Patient Stated Goal: to go to rehab first, then go home once she is more independent.  Progress towards PT goals: Not progressing toward goals - comment (limited by pain)    Frequency  7X/week    PT Plan  Current plan remains appropriate       End of Session Equipment Utilized During Treatment: Right knee immobilizer Activity Tolerance: Patient limited by pain Patient left: in bed;with call bell/phone within reach     Time: 1200-1228 PT Time Calculation (min) (ACUTE ONLY): 28 min  Charges:  $Therapeutic Activity: 23-37 mins                      Yohanna Tow B. Nerida Boivin, PT, DPT [phone]   06/24/2014, 12:39 PM

## 2014-06-24 NOTE — Care Management Note (Unsigned)
    Page 1 of 1   06/24/2014     10:45:10 AM CARE MANAGEMENT NOTE 06/24/2014  Patient:  Mary Paul, Mary Paul   Account Number:  [id number]  Date Initiated:  06/24/2014  Documentation initiated by:  Shemeika Starzyk  Subjective/Objective Assessment:   Pt s/p rt TKR on 06/22/14.  PTA, pt independent, lives with family.  Husband travels out of town, unable to assist.     Action/Plan:   PT recommending SNF at dc; referral to CSW to facilitate dc to SNF for rehab prior to dc.  Pt agreeable to this plan.   Anticipated DC Date:  06/25/2014   Anticipated DC Plan:  SKILLED NURSING FACILITY  In-house referral  Clinical Social Worker      DC Planning Services  CM consult      Choice offered to / List presented to:             Status of service:  In process, will continue to follow Medicare Important Message given?  NO (If response is "NO", the following Medicare IM given date fields will be blank) Date Medicare IM given:   Medicare IM given by:   Date Additional Medicare IM given:   Additional Medicare IM given by:    Discharge Disposition:    Per UR Regulation:  Reviewed for med. necessity/level of care/duration of stay  If discussed at Long Length of Stay Meetings, dates discussed:    Comments:

## 2014-06-24 NOTE — Clinical Social Work Psychosocial (Signed)
Clinical Social Work Department BRIEF PSYCHOSOCIAL ASSESSMENT 06/24/2014  Patient:  Mary Paul, Mary Paul     Account Number:  [id number]     Admit date:  06/22/2014  Clinical Social Worker:  Read Drivers  Date/Time:  06/23/2014 02:07 PM  Referred by:  Physician  Date Referred:  06/23/2014 Referred for  SNF Placement   Other Referral:   none   Interview type:  Patient Other interview type:   none    PSYCHOSOCIAL DATA Living Status:  FAMILY Admitted from facility:   Level of care:   Primary support name:  Algernon Huxley Primary support relationship to patient:  SPOUSE Degree of support available:   strong    CURRENT CONCERNS Current Concerns  Post-Acute Placement   Other Concerns:    SOCIAL WORK ASSESSMENT / PLAN CSW assessed patient.  Patient was alert and oriented x4. Patient is requesting SNF/STR at time of dc.  Patient reports being from home wiht spouse, 2 teenage children and 3 small dogs.  Patient reports not feeling safe to return home before STR.  Patient reports ambulating with crutches/cane prior to hospitalization.  Patient requests SNF search of [facility] and requesting a facility in [place].  Patient was agreeable to [facility] and [facility] search.   Assessment/plan status:  Psychosocial Support/Ongoing Assessment of Needs Other assessment/ plan:   FL2  PASARR   Information/referral to community resources:   SNF/STR    PATIENT'S/FAMILY'S RESPONSE TO PLAN OF CARE: patient is agreeable to SNF search of both [place] and [facility] with a facility in [place] being her first choice.       Vickii Penna, LCSWA [phone]  Psychiatric & Orthopedics (5N 1-16) Clinical Social Worker

## 2014-06-24 NOTE — Progress Notes (Signed)
Orthopedic Tech Progress Note Patient Details:  Mary Paul 23-Jul-1970 [id number] On cpm at 7:55 pm RLE 0-35 Patient ID: Mary Paul, female   DOB: 22-Sep-1970, 44 y.o.   MRN: [id number]   Mary Paul 06/24/2014, 7:56 PM

## 2014-06-24 NOTE — Progress Notes (Signed)
Patient ID: Mary Paul, female   DOB: September 06, 1970, 44 y.o.   MRN: [id number] Postoperative day 2 total knee arthroplasty. Patient is planning for discharge to skilled nursing. The F L2 is completed.

## 2014-06-24 NOTE — Clinical Social Work Placement (Addendum)
Clinical Social Work Department CLINICAL SOCIAL WORK PLACEMENT NOTE 06/24/2014  Patient:  Mary Paul, Mary Paul  Account Number:  [id number] Admit date:  06/22/2014  Clinical Social Worker:  Mosie Epstein  Date/time:  06/24/2014 02:07 PM  Clinical Social Work is seeking post-discharge placement for this patient at the following level of care:   SKILLED NURSING   (*CSW will update this form in Epic as items are completed)   06/23/2014  Patient/family provided with [facility] Department of Clinical Social Work's list of facilities offering this level of care within the geographic area requested by the patient (or if unable, by the patient's family).  06/23/2014  Patient/family informed of their freedom to choose among providers that offer the needed level of care, that participate in Medicare, Medicaid or managed care program needed by the patient, have an available bed and are willing to accept the patient.  06/23/2014  Patient/family informed of [facility]' ownership interest in [facility], as well as of the fact that they are under no obligation to receive care at this facility.  PASARR submitted to EDS on 06/23/2014 PASARR number received on 06/23/2014  FL2 transmitted to all facilities in geographic area requested by pt/family on  06/23/2014 FL2 transmitted to all facilities within larger geographic area on   Patient informed that his/her managed care company has contracts with or will negotiate with  certain facilities, including the following:     Patient/family informed of bed offers received:  06/24/2014 Patient chooses bed at [facility] Physician recommends and patient chooses bed at    Patient to be transferred to  [facility] on  06/27/2014 Patient to be transferred to facility by [facility]AR Patient and family notified of transfer on 06/27/2014 Name of family member notified:  Pt updated at bedside, pt alert and oriented x4.  The following  physician request were entered in Epic:   Additional Comments:  Lily Kocher [phone]) Licensed Clinical Social Worker Orthopedics [phone]) and Surgical [phone])

## 2014-06-25 LAB — CBC
HCT: 28.8 % — ABNORMAL LOW (ref 36.0–46.0)
Hemoglobin: 9.5 g/dL — ABNORMAL LOW (ref 12.0–15.0)
MCH: 28.5 pg (ref 26.0–34.0)
MCHC: 33 g/dL (ref 30.0–36.0)
MCV: 86.5 fL (ref 78.0–100.0)
Platelets: 216 10*3/uL (ref 150–400)
RBC: 3.33 MIL/uL — ABNORMAL LOW (ref 3.87–5.11)
RDW: 13.7 % (ref 11.5–15.5)
WBC: 6.2 10*3/uL (ref 4.0–10.5)

## 2014-06-25 NOTE — Clinical Social Work Note (Signed)
CSW met with patient who accepts bed offer made by [facility]. CSW contacted Irven Shelling of [facility] and made her aware. Debbie states facility can admit patient on Sunday, 1/3. CSW to follow tomorrow.  [place], [place] Weekend Clinical Social Worker [phone]

## 2014-06-25 NOTE — Progress Notes (Signed)
Physical Therapy Treatment Patient Details Name: Mary Paul MRN: [id number] DOB: 1971/05/28 Today's Date: 06/25/2014    History of Present Illness 44 y.o. female admitted to [facility] on 07/23/13 for elective R TKA.  Pt with significant PMHx of anxiety, head injury, depression, and frequent urination.    PT Comments    Pt slowly progressing towards goals. Pt tolerated CPM x 4 hours today at 0-35 deg. Pt strongly encouraged to increased ROM by 5 deg every 5 min to increase R knee ROM. Pt with improved ambulation tolerance however remains significantly limited by pain and anxiety. con't to recommend SNF.   Follow Up Recommendations  SNF     Equipment Recommendations  Rolling walker with 5" wheels    Recommendations for Other Services       Precautions / Restrictions Precautions Precautions: Knee;Fall Precaution Comments: WBAT status reviewed with pt, no pillow under knee Restrictions Weight Bearing Restrictions: Yes RLE Weight Bearing: Weight bearing as tolerated    Mobility  Bed Mobility Overal bed mobility: Needs Assistance Bed Mobility: Supine to Sit     Supine to sit: Min assist;HOB elevated     General bed mobility comments: assist for R LE management, verbal directional cues  Transfers Overall transfer level: Needs assistance Equipment used: Rolling walker (2 wheeled) Transfers: Sit to/from Stand Sit to Stand: Min assist         General transfer comment: v/c's for safe hand placement, v/c's for R LE management. educated on not angling to the Left to sit and to bend R hip and knee  Ambulation/Gait Ambulation/Gait assistance: Min assist Ambulation Distance (Feet): 30 Feet (standing rest break at 15 feet) Assistive device: Rolling walker (2 wheeled) Gait Pattern/deviations: Step-to pattern;Antalgic Gait velocity: decreased   General Gait Details: signifcant bilat UE WBing, v/c's to increased R LE WBing and to complete quad set during stance phase.     Stairs            Wheelchair Mobility    Modified Rankin (Stroke Patients Only)       Balance Overall balance assessment: Needs assistance Sitting-balance support: Feet supported;Single extremity supported Sitting balance-Leahy Scale: Fair     Standing balance support: Bilateral upper extremity supported Standing balance-Leahy Scale: Poor                      Cognition Arousal/Alertness: Awake/alert Behavior During Therapy: Anxious Overall Cognitive Status: Within Functional Limits for tasks assessed                      Exercises Total Joint Exercises Ankle Circles/Pumps: AROM;Both;20 reps;Supine Quad Sets: AROM;Right;10 reps;Supine Heel Slides: AAROM;Right;5 reps;Seated Goniometric ROM: 23 R knee flex    General Comments        Pertinent Vitals/Pain Pain Assessment: 0-10 Pain Score: 8  Pain Location: R knee (10/10 during amb) Pain Descriptors / Indicators: Aching Pain Intervention(s): Limited activity within patient's tolerance    Home Living                      Prior Function            PT Goals (current goals can now be found in the care plan section) Progress towards PT goals: Progressing toward goals    Frequency  7X/week    PT Plan Current plan remains appropriate    Co-evaluation             End of Session   Activity Tolerance:  Patient limited by pain Patient left: in chair;with call bell/phone within reach     Time: 1450-1509 PT Time Calculation (min) (ACUTE ONLY): 19 min  Charges:  $Gait Training: 8-22 mins                    G Codes:      Marcene Brawn 06/25/2014, 4:34 PM   Lewis Shock, PT, DPT Pager #: [phone] Office #: [phone]

## 2014-06-25 NOTE — Progress Notes (Signed)
Patient ID: Mary Paul, female   DOB: 06-20-1971, 44 y.o.   MRN: [id number] Patient is making stable progress. She is using the CPM for about an hour each day. Anticipate discharge to skilled nursing. Patient has talked to the social worker regarding locations. We'll discharge once a bed is available.

## 2014-06-26 NOTE — Progress Notes (Signed)
Physical Therapy Treatment Patient Details Name: Mary Paul MRN: [id number] DOB: [birth year] Today's Date: 06/26/2014    History of Present Illness 44 y.o. female admitted to [facility] on 07/23/13 for elective R TKA.  Pt with significant PMHx of anxiety, head injury, depression, and frequent urination.    PT Comments    Pain continues to limit Range and strength R knee, but pt showing good participatory effort with encouragement; continue to recommend SNF for post-acute rehab to maximize independence and safety with mobility   Follow Up Recommendations  SNF     Equipment Recommendations  Rolling walker with 5" wheels    Recommendations for Other Services       Precautions / Restrictions Precautions Precautions: Knee;Fall Precaution Comments: WBAT status reviewed with pt, no pillow under knee Restrictions Weight Bearing Restrictions: Yes RLE Weight Bearing: Weight bearing as tolerated    Mobility  Bed Mobility Overal bed mobility: Needs Assistance Bed Mobility: Supine to Sit     Supine to sit: HOB elevated;Min guard     General bed mobility comments:  verbal directional cues, especiall to acheive fully upright sitting -- significant antalgic L lean upon firts sitting up  Transfers Overall transfer level: Needs assistance Equipment used: Rolling walker (2 wheeled) Transfers: Sit to/from Stand Sit to Stand: Min guard         General transfer comment: v/c's for safe hand placement, v/c's for R LE management. educated on not angling to the Left to sit and to bend R hip and knee  Ambulation/Gait Ambulation/Gait assistance: Min guard Ambulation Distance (Feet): 45 Feet (on standing rest break) Assistive device: Rolling walker (2 wheeled) Gait Pattern/deviations: Step-to pattern Gait velocity: decreased   General Gait Details: signifcant bilat UE WBing, v/c's to increased R LE WBing and to complete quad set during stance phase.    Stairs            Wheelchair  Mobility    Modified Rankin (Stroke Patients Only)       Balance     Sitting balance-Leahy Scale: Fair       Standing balance-Leahy Scale: Poor                      Cognition Arousal/Alertness: Awake/alert Behavior During Therapy: Anxious;WFL for tasks assessed/performed Overall Cognitive Status: Within Functional Limits for tasks assessed                      Exercises Total Joint Exercises Ankle Circles/Pumps: AROM;Both;20 reps;Supine Quad Sets: AROM;Right;10 reps;Supine Short Arc Quad: 10 reps;AAROM Heel Slides: AAROM;Right;5 reps (extreme pain, with muscle guarding) Straight Leg Raises: AAROM;Right Goniometric ROM: Very limited, gaurded; approx 9-30 deg    General Comments        Pertinent Vitals/Pain Pain Assessment: 0-10 Pain Score: 8  Pain Location: R knee Pain Descriptors / Indicators: Aching;Grimacing Pain Intervention(s): Monitored during session;Premedicated before session;Patient requesting pain meds-RN notified    Home Living                      Prior Function            PT Goals (current goals can now be found in the care plan section) Acute Rehab PT Goals Patient Stated Goal: to go to rehab first, then go home once she is more independent.  PT Goal Formulation: With patient Time For Goal Achievement: 06/30/14 Potential to Achieve Goals: Good Progress towards PT goals: Progressing toward goals  Frequency  7X/week    PT Plan Current plan remains appropriate    Co-evaluation             End of Session   Activity Tolerance: Patient limited by pain;Patient tolerated treatment well Patient left: in chair;with call bell/phone within reach     Time: 0920-0950 PT Time Calculation (min) (ACUTE ONLY): 30 min  Charges:  $Gait Training: 8-22 mins $Therapeutic Exercise: 8-22 mins                    G Codes:      Olen Pel 06/26/2014, 10:54 AM Van Clines, PT  Acute Rehabilitation  Services Pager [phone] Office [phone]

## 2014-06-26 NOTE — Progress Notes (Signed)
Patient ID: Mary Paul, female   DOB: [birth year], 44 y.o.   MRN: [id number] Patient is making stable progress with her therapy. We will have the dressing changed today. Plan for discharge to skilled nursing on Monday.

## 2014-06-27 MED ORDER — MAGNESIUM CITRATE PO SOLN
0.5000 | Freq: Once | ORAL | Status: AC
Start: 2014-06-27 — End: 2014-06-27
  Administered 2014-06-27: 0.5 via ORAL
  Filled 2014-06-27: qty 296

## 2014-06-27 NOTE — Progress Notes (Signed)
Pt prepared for d/c to SNF. IV d/c'd. Skin intact except as most recently charted. Vitals are stable. Report called to receiving facility. Pt to be transported by ambulance service. 

## 2014-06-27 NOTE — Progress Notes (Signed)
CARE MANAGEMENT NOTE 06/27/2014  Patient:  ENEDELIA, MARTORELLI   Account Number:  [id number]  Date Initiated:  06/24/2014  Documentation initiated by:  AMERSON,JULIE  Subjective/Objective Assessment:   Pt s/p rt TKR on 06/22/14.  PTA, pt independent, lives with family.  Husband travels out of town, unable to assist.     Action/Plan:   PT recommending SNF at dc; referral to CSW to facilitate dc to SNF for rehab prior to dc.  Pt agreeable to this plan.   Anticipated DC Date:  06/25/2014   Anticipated DC Plan:  SKILLED NURSING FACILITY  In-house referral  Clinical Social Worker      DC Planning Services  CM consult      Choice offered to / List presented to:             Status of service:  Completed, signed off Medicare Important Message given?  NO (If response is "NO", the following Medicare IM given date fields will be blank) Date Medicare IM given:   Medicare IM given by:   Date Additional Medicare IM given:   Additional Medicare IM given by:    Discharge Disposition:  SKILLED NURSING FACILITY  Per UR Regulation:  Reviewed for med. necessity/level of care/duration of stay  If discussed at Long Length of Stay Meetings, dates discussed:    Comments:  Chart reviewed. Plan dc to SNF today. Isidoro Donning RN CCM Case Mgmt

## 2014-06-27 NOTE — Progress Notes (Signed)
Orthopedic Tech Progress Note Patient Details:  Mary Paul 1970/10/09 [id number]  Ortho Devices Ortho Device/Splint Location: rle Ortho Device/Splint Interventions: Ordered As ordered by Dr. Dawna Part, Mary Paul 06/27/2014, 3:40 PM

## 2014-06-27 NOTE — Clinical Social Work Note (Signed)
Pt to be discharged to [facility]. Pt updated regarding discharge.  Facility: [facility] [place] Report number: [phone] Transportation: EMS ([address])  Marcelline Deist, [place] [phone]) Licensed Clinical Social Worker Orthopedics [phone]) and Surgical [phone])

## 2014-06-27 NOTE — Progress Notes (Signed)
Subjective: Doing well.  Ready for transfer to snf.  No BM yet.  Pos flatus.    Objective: Vital signs in last 24 hours: Temp:  [98.6 F (37 C)-98.7 F (37.1 C)] 98.6 F (37 C) (01/04 0554) Pulse Rate:  [91-93] 93 (01/04 0554) Resp:  [18] 18 (01/04 0554) BP: (121-125)/(44-48) 125/48 mmHg (01/04 0554) SpO2:  [93 %-99 %] 99 % (01/04 0554)  Intake/Output from previous day: 01/03 0701 - 01/04 0700 In: 1320 [P.O.:1320] Out: -  Intake/Output this shift:     Recent Labs  06/25/14 0320  HGB 9.5*    Recent Labs  06/25/14 0320  WBC 6.2  RBC 3.33*  HCT 28.8*  PLT 216   No results for input(s): NA, K, CL, CO2, BUN, CREATININE, GLUCOSE, CALCIUM in the last 72 hours. No results for input(s): LABPT, INR in the last 72 hours.  Exam:  Wounds look good.  No drainage or signs of infection.  Calf nt  NVI.    Assessment/Plan: Transfer to snf today if bed available.  F/u with dr Ophelia Charter 2 weeks postop as scheduled.     Kiamesha Samet M 06/27/2014, 12:38 PM

## 2014-06-27 NOTE — Progress Notes (Signed)
Physical Therapy Treatment Patient Details Name: Mary Paul MRN: [id number] DOB: Mary Paul's Date: 06/27/2014    History of Present Illness 44 y.o. female admitted to [facility] on 07/23/13 for elective R TKA.  Pt with significant PMHx of anxiety, head injury, depression, and frequent urination.    PT Comments    Pt with improved ability ambulation tolerance this date however pain con't be limiting factor in improving R Knee ROM and strength. Pt cont' to benefit from snf to achieve safe mod I level of function.  Follow Up Recommendations  SNF     Equipment Recommendations  Rolling walker with 5" wheels    Recommendations for Other Services       Precautions / Restrictions Precautions Precautions: Knee;Fall Restrictions Weight Bearing Restrictions: Yes RLE Weight Bearing: Weight bearing as tolerated    Mobility  Bed Mobility Overal bed mobility: Needs Assistance Bed Mobility: Supine to Sit     Supine to sit: HOB elevated;Min guard     General bed mobility comments: pt unable to adduct R LE to eob, pt used UE to move R LE off EOB  Transfers Overall transfer level: Needs assistance Equipment used: Rolling walker (2 wheeled) Transfers: Sit to/from Stand Sit to Stand: Min guard         General transfer comment: v/c's for safe hand placement  Ambulation/Gait Ambulation/Gait assistance: Min guard Ambulation Distance (Feet): 70 Feet Assistive device: Rolling walker (2 wheeled) Gait Pattern/deviations: Step-to pattern     General Gait Details: significant UE wbing, extremely antalgic, encouraged pt in increase R LE WBing   Stairs            Wheelchair Mobility    Modified Rankin (Stroke Patients Only)       Balance           Standing balance support: Single extremity supported   Standing balance comment: pt able to stand at sink to wash hands without LOB                    Cognition Arousal/Alertness: Awake/alert Behavior During  Therapy: Anxious;WFL for tasks assessed/performed Overall Cognitive Status: Within Functional Limits for tasks assessed                      Exercises Total Joint Exercises Ankle Circles/Pumps: AROM;Both;20 reps;Supine Quad Sets: AROM;Right;10 reps;Supine Short Arc Quad: 10 reps;AAROM Heel Slides: AAROM;Right;10 reps  Pt achieve 30 deg active R knee flex, approx 45 with assist of PT - limited by pain    General Comments General comments (skin integrity, edema, etc.): pt ambulated into bathroom and was supervision for hygiene      Pertinent Vitals/Pain Pain Assessment: 0-10 Pain Score: 6  Pain Location: R knee, reports L LE sore as well Pain Descriptors / Indicators: Aching;Sore Pain Intervention(s): Monitored during session    Home Living                      Prior Function            PT Goals (current goals can now be found in the care plan section) Progress towards PT goals: Progressing toward goals    Frequency  7X/week    PT Plan Current plan remains appropriate    Co-evaluation             End of Session   Activity Tolerance: Patient limited by fatigue;Patient limited by pain Patient left: in chair;with call bell/phone within reach  Time: 1610-9604 PT Time Calculation (min) (ACUTE ONLY): 28 min  Charges:  $Gait Training: 8-22 mins $Therapeutic Exercise: 8-22 mins                    G Codes:      Mary Paul 06/27/2014, 10:19 AM   Mary Paul, PT, DPT Pager #: [phone] Office #: [phone]

## 2014-06-27 NOTE — Discharge Summary (Signed)
Physician Discharge Summary  Patient ID: Mary Paul MRN: [id number] DOB/AGE: [birth year] 44 y.o.  Admit date: 06/22/2014 Discharge date: 06/27/2014  Admission Diagnoses: right knee djd  Discharge Diagnoses:  Active Problems:   Total knee replacement status   OA (osteoarthritis) of knee   Discharged Condition: good  Hospital Course:  Patient was admitted to the hospital after having scheduled right total knee replacement.  Tolerated surgery well and without complication.  Hospital stay was uneventful.  Started PT protocol and they recommended SNF placement for rehab.  Patient seen today and stable for discharge.    Consults: none   Discharge Exam: Blood pressure 125/48, pulse 93, temperature 98.6 F (37 C), temperature source Oral, resp. rate 18, height  (1.575 m), weight 117.935 kg (260 lb), last menstrual period 05/16/2014, SpO2 99 %. Exam:  Knee wounds look good.  Steri strips intact.  No drainage or signs of infection.  Calf nontender, NVI.  Alert and oriented.   Disposition: transfer to SNF  Discharge Instructions    Call MD / Call 911    Complete by:  As directed   If you experience chest pain or shortness of breath, CALL 911 and be transported to the hospital emergency room.  If you develope a fever above 101 F, pus (white drainage) or increased drainage or redness at the wound, or calf pain, call your surgeon's office.     Change dressing    Complete by:  As directed   Change dressing on right knee daily with sterile 4 x 4 inch gauze dressing and apply TED hose.     Constipation Prevention    Complete by:  As directed   Drink plenty of fluids.  Prune juice may be helpful.  You may use a stool softener, such as Colace (over the counter) 100 mg twice a day.  Use MiraLax (over the counter) for constipation as needed.     Diet - low sodium heart healthy    Complete by:  As directed      Discharge instructions    Complete by:  As directed   Ok to shower, but no tub  soaking.  Do not apply any creams or ointments to incision.  Continue physical therapy protocol.     Do not put a pillow under the knee. Place it under the heel.    Complete by:  As directed      Driving restrictions    Complete by:  As directed   No driving until further notice.     Increase activity slowly as tolerated    Complete by:  As directed      Lifting restrictions    Complete by:  As directed   No lifting until further notice.            Medication List    STOP taking these medications        BC HEADACHE POWDER PO     ibuprofen 200 MG tablet  Commonly known as:  ADVIL,MOTRIN      TAKE these medications        aspirin EC 325 MG tablet  Take 1 tablet (325 mg total) by mouth daily.     busPIRone 10 MG tablet  Commonly known as:  BUSPAR  Take 10 mg by mouth 3 (three) times daily.     clonazePAM 1 MG tablet  Commonly known as:  KLONOPIN  Take 1 mg by mouth at bedtime.     FLUoxetine 20 MG capsule  Commonly known as:  PROZAC  Take 60 mg by mouth at bedtime.     levothyroxine 150 MCG tablet  Commonly known as:  SYNTHROID, LEVOTHROID  Take 150 mcg by mouth daily before breakfast.     methocarbamol 500 MG tablet  Commonly known as:  ROBAXIN  Take 1 tablet (500 mg total) by mouth every 6 (six) hours as needed for muscle spasms.     ondansetron 4 MG tablet  Commonly known as:  ZOFRAN  Take 1 tablet (4 mg total) by mouth every 8 (eight) hours as needed for nausea.     oxybutynin 5 MG tablet  Commonly known as:  DITROPAN  Take 5 mg by mouth 2 (two) times daily.     oxyCODONE-acetaminophen 10-325 MG per tablet  Commonly known as:  PERCOCET  Take 1 tablet by mouth every 6 (six) hours as needed.           Follow-up Information    Follow up with Eldred Manges, MD.   Specialty:  Orthopedic Surgery   Why:  need return office visit 2 weeks postop   Contact information:   [address] [place] [address] [phone]        Signed: Naida Sleight 06/27/2014, 12:41 PM

## 2016-07-03 ENCOUNTER — Ambulatory Visit (INDEPENDENT_AMBULATORY_CARE_PROVIDER_SITE_OTHER): Payer: PRIVATE HEALTH INSURANCE | Admitting: "Endocrinology

## 2016-07-03 ENCOUNTER — Encounter: Payer: Self-pay | Admitting: "Endocrinology

## 2016-07-03 VITALS — BP 139/88 | HR 102 | Ht 63.0 in | Wt 284.0 lb

## 2016-07-03 DIAGNOSIS — E890 Postprocedural hypothyroidism: Secondary | ICD-10-CM

## 2016-07-03 DIAGNOSIS — R7303 Prediabetes: Secondary | ICD-10-CM | POA: Insufficient documentation

## 2016-07-03 DIAGNOSIS — E782 Mixed hyperlipidemia: Secondary | ICD-10-CM | POA: Diagnosis not present

## 2016-07-03 MED ORDER — LEVOTHYROXINE SODIUM 175 MCG PO TABS: 175.0000 ug | ORAL_TABLET | Freq: Every day | ORAL | 1 refills | Status: DC
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
## 2016-07-03 NOTE — Patient Instructions (Signed)

## 2016-08-05 ENCOUNTER — Other Ambulatory Visit: Payer: Self-pay | Admitting: "Endocrinology

## 2016-08-21 ENCOUNTER — Other Ambulatory Visit: Payer: Self-pay | Admitting: "Endocrinology

## 2016-08-22 LAB — HEMOGLOBIN A1C
Hgb A1c MFr Bld: 5.3 % (ref <5.7)
Mean Plasma Glucose: 105 mg/dL

## 2016-08-22 LAB — COMPREHENSIVE METABOLIC PANEL
ALT: 22 U/L (ref 6–29)
AST: 15 U/L (ref 10–35)
Albumin: 4.1 g/dL (ref 3.6–5.1)
Alkaline Phosphatase: 48 U/L (ref 33–115)
BUN: 11 mg/dL (ref 7–25)
CO2: 26 mmol/L (ref 20–31)
Calcium: 9.4 mg/dL (ref 8.6–10.2)
Chloride: 104 mmol/L (ref 98–110)
Creat: 1.03 mg/dL (ref 0.50–1.10)
Glucose, Bld: 109 mg/dL — ABNORMAL HIGH (ref 65–99)
Potassium: 4.7 mmol/L (ref 3.5–5.3)
Sodium: 135 mmol/L (ref 135–146)
Total Bilirubin: 0.4 mg/dL (ref 0.2–1.2)
Total Protein: 6.9 g/dL (ref 6.1–8.1)

## 2016-08-22 LAB — TSH: TSH: 0.65 m[IU]/L

## 2016-08-22 LAB — T4, FREE: Free T4: 1.7 ng/dL (ref 0.8–1.8)

## 2016-08-28 ENCOUNTER — Encounter: Payer: Self-pay | Admitting: "Endocrinology

## 2016-08-28 ENCOUNTER — Ambulatory Visit (INDEPENDENT_AMBULATORY_CARE_PROVIDER_SITE_OTHER): Payer: PRIVATE HEALTH INSURANCE | Admitting: "Endocrinology

## 2016-08-28 VITALS — BP 132/82 | HR 98 | Ht 63.0 in | Wt 274.0 lb

## 2016-08-28 DIAGNOSIS — E890 Postprocedural hypothyroidism: Secondary | ICD-10-CM

## 2016-08-28 DIAGNOSIS — R7303 Prediabetes: Secondary | ICD-10-CM | POA: Diagnosis not present

## 2016-08-28 DIAGNOSIS — E782 Mixed hyperlipidemia: Secondary | ICD-10-CM | POA: Diagnosis not present

## 2016-08-28 MED ORDER — LEVOTHYROXINE SODIUM 175 MCG PO TABS: ORAL_TABLET | ORAL | 6 refills | Status: DC
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
## 2017-03-05 ENCOUNTER — Ambulatory Visit: Payer: PRIVATE HEALTH INSURANCE | Admitting: "Endocrinology

## 2018-01-06 ENCOUNTER — Emergency Department (HOSPITAL_COMMUNITY): Payer: BLUE CROSS/BLUE SHIELD

## 2018-01-06 ENCOUNTER — Other Ambulatory Visit: Payer: Self-pay

## 2018-01-06 ENCOUNTER — Encounter (HOSPITAL_COMMUNITY): Payer: Self-pay | Admitting: Emergency Medicine

## 2018-01-06 ENCOUNTER — Emergency Department (HOSPITAL_COMMUNITY)
Admission: EM | Admit: 2018-01-06 | Discharge: 2018-01-06 | Disposition: A | Discharge status: Home / Self Care | Payer: BLUE CROSS/BLUE SHIELD | Attending: Emergency Medicine | Admitting: Emergency Medicine

## 2018-01-06 DIAGNOSIS — E039 Hypothyroidism, unspecified: Secondary | ICD-10-CM | POA: Insufficient documentation

## 2018-01-06 DIAGNOSIS — R1011 Right upper quadrant pain: Secondary | ICD-10-CM | POA: Diagnosis not present

## 2018-01-06 DIAGNOSIS — R1031 Right lower quadrant pain: Secondary | ICD-10-CM | POA: Insufficient documentation

## 2018-01-06 DIAGNOSIS — R112 Nausea with vomiting, unspecified: Secondary | ICD-10-CM | POA: Diagnosis not present

## 2018-01-06 DIAGNOSIS — R109 Unspecified abdominal pain: Secondary | ICD-10-CM

## 2018-01-06 DIAGNOSIS — Z79899 Other long term (current) drug therapy: Secondary | ICD-10-CM | POA: Diagnosis not present

## 2018-01-06 DIAGNOSIS — F419 Anxiety disorder, unspecified: Secondary | ICD-10-CM | POA: Insufficient documentation

## 2018-01-06 DIAGNOSIS — Z9049 Acquired absence of other specified parts of digestive tract: Secondary | ICD-10-CM | POA: Diagnosis not present

## 2018-01-06 DIAGNOSIS — Z96651 Presence of right artificial knee joint: Secondary | ICD-10-CM | POA: Diagnosis not present

## 2018-01-06 DIAGNOSIS — F329 Major depressive disorder, single episode, unspecified: Secondary | ICD-10-CM | POA: Insufficient documentation

## 2018-01-06 LAB — CBC
HCT: 38 % (ref 36.0–46.0)
Hemoglobin: 12.9 g/dL (ref 12.0–15.0)
MCH: 28.5 pg (ref 26.0–34.0)
MCHC: 33.9 g/dL (ref 30.0–36.0)
MCV: 84.1 fL (ref 78.0–100.0)
Platelets: 266 10*3/uL (ref 150–400)
RBC: 4.52 MIL/uL (ref 3.87–5.11)
RDW: 13 % (ref 11.5–15.5)
WBC: 10.4 10*3/uL (ref 4.0–10.5)

## 2018-01-06 LAB — COMPREHENSIVE METABOLIC PANEL
ALT: 17 U/L (ref 0–44)
AST: 14 U/L — ABNORMAL LOW (ref 15–41)
Albumin: 4 g/dL (ref 3.5–5.0)
Alkaline Phosphatase: 56 U/L (ref 38–126)
Anion gap: 7 (ref 5–15)
BUN: 10 mg/dL (ref 6–20)
CO2: 27 mmol/L (ref 22–32)
Calcium: 9.3 mg/dL (ref 8.9–10.3)
Chloride: 103 mmol/L (ref 98–111)
Creatinine, Ser: 1.03 mg/dL — ABNORMAL HIGH (ref 0.44–1.00)
GFR calc Af Amer: >60 mL/min (ref >60)
GFR calc non Af Amer: >60 mL/min (ref >60)
Glucose, Bld: 93 mg/dL (ref 70–99)
Potassium: 3.8 mmol/L (ref 3.5–5.1)
Sodium: 137 mmol/L (ref 135–145)
Total Bilirubin: 0.6 mg/dL (ref 0.3–1.2)
Total Protein: 7.3 g/dL (ref 6.5–8.1)

## 2018-01-06 LAB — URINALYSIS, ROUTINE W REFLEX MICROSCOPIC
Bilirubin Urine: NEGATIVE
Glucose, UA: NEGATIVE mg/dL
Hgb urine dipstick: NEGATIVE
Ketones, ur: NEGATIVE mg/dL
Nitrite: NEGATIVE
Protein, ur: NEGATIVE mg/dL
Specific Gravity, Urine: 1.005 (ref 1.005–1.030)
pH: 5 (ref 5.0–8.0)

## 2018-01-06 LAB — PREGNANCY, URINE: Preg Test, Ur: NEGATIVE

## 2018-01-06 LAB — LIPASE, BLOOD: Lipase: 28 U/L (ref 11–51)

## 2018-01-06 MED ORDER — HYDROCODONE-ACETAMINOPHEN 5-325 MG PO TABS: 1.0000 | ORAL_TABLET | ORAL | 0 refills | Status: DC | PRN

## 2018-01-06 MED ORDER — MORPHINE SULFATE (PF) 4 MG/ML IV SOLN
4.0000 mg | Freq: Once | INTRAVENOUS | Status: AC
  Administered 2018-01-06: 4 mg via INTRAVENOUS
  Filled 2018-01-06: qty 1

## 2018-01-06 MED ORDER — SODIUM CHLORIDE 0.9 % IV BOLUS
1000.0000 mL | Freq: Once | INTRAVENOUS | Status: AC
  Administered 2018-01-06: 1000 mL via INTRAVENOUS

## 2018-01-06 MED ORDER — ONDANSETRON HCL 4 MG/2ML IJ SOLN
4.0000 mg | Freq: Once | INTRAMUSCULAR | Status: AC
  Administered 2018-01-06: 4 mg via INTRAVENOUS
  Filled 2018-01-06: qty 2

## 2018-01-06 MED ORDER — ONDANSETRON 4 MG PO TBDP: 4.0000 mg | ORAL_TABLET | Freq: Three times a day (TID) | ORAL | 0 refills | Status: DC | PRN
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
## 2018-01-06 NOTE — ED Triage Notes (Signed)
Pt c/o right flank pain and was seen at urgent care for the same.

## 2018-01-15 DIAGNOSIS — R609 Edema, unspecified: Secondary | ICD-10-CM | POA: Diagnosis not present

## 2018-01-15 DIAGNOSIS — R0602 Shortness of breath: Secondary | ICD-10-CM | POA: Diagnosis not present

## 2018-01-15 DIAGNOSIS — Z6841 Body Mass Index (BMI) 40.0 and over, adult: Secondary | ICD-10-CM | POA: Diagnosis not present

## 2018-01-15 DIAGNOSIS — R079 Chest pain, unspecified: Secondary | ICD-10-CM | POA: Diagnosis not present

## 2018-04-08 DIAGNOSIS — Z0000 Encounter for general adult medical examination without abnormal findings: Secondary | ICD-10-CM | POA: Diagnosis not present

## 2018-04-08 DIAGNOSIS — E785 Hyperlipidemia, unspecified: Secondary | ICD-10-CM | POA: Diagnosis not present

## 2018-04-08 DIAGNOSIS — Z0389 Encounter for observation for other suspected diseases and conditions ruled out: Secondary | ICD-10-CM | POA: Diagnosis not present

## 2018-04-08 DIAGNOSIS — R5383 Other fatigue: Secondary | ICD-10-CM | POA: Diagnosis not present

## 2018-04-08 DIAGNOSIS — E559 Vitamin D deficiency, unspecified: Secondary | ICD-10-CM | POA: Diagnosis not present

## 2018-04-08 DIAGNOSIS — E282 Polycystic ovarian syndrome: Secondary | ICD-10-CM | POA: Diagnosis not present

## 2018-04-08 DIAGNOSIS — E039 Hypothyroidism, unspecified: Secondary | ICD-10-CM | POA: Diagnosis not present

## 2018-04-13 ENCOUNTER — Encounter: Payer: Self-pay | Admitting: *Deleted

## 2018-04-16 DIAGNOSIS — R358 Other polyuria: Secondary | ICD-10-CM | POA: Diagnosis not present

## 2018-04-22 ENCOUNTER — Other Ambulatory Visit (HOSPITAL_COMMUNITY): Payer: Self-pay | Admitting: Internal Medicine

## 2018-04-22 DIAGNOSIS — Z1231 Encounter for screening mammogram for malignant neoplasm of breast: Secondary | ICD-10-CM

## 2018-04-29 DIAGNOSIS — J019 Acute sinusitis, unspecified: Secondary | ICD-10-CM | POA: Diagnosis not present

## 2018-04-29 DIAGNOSIS — E559 Vitamin D deficiency, unspecified: Secondary | ICD-10-CM | POA: Diagnosis not present

## 2018-04-29 DIAGNOSIS — F330 Major depressive disorder, recurrent, mild: Secondary | ICD-10-CM | POA: Diagnosis not present

## 2018-04-29 DIAGNOSIS — R358 Other polyuria: Secondary | ICD-10-CM | POA: Diagnosis not present

## 2018-05-25 DIAGNOSIS — E039 Hypothyroidism, unspecified: Secondary | ICD-10-CM | POA: Diagnosis not present

## 2018-05-25 DIAGNOSIS — Z713 Dietary counseling and surveillance: Secondary | ICD-10-CM | POA: Diagnosis not present

## 2018-05-25 DIAGNOSIS — G4700 Insomnia, unspecified: Secondary | ICD-10-CM | POA: Diagnosis not present

## 2018-05-25 DIAGNOSIS — E282 Polycystic ovarian syndrome: Secondary | ICD-10-CM | POA: Diagnosis not present

## 2018-05-25 DIAGNOSIS — R358 Other polyuria: Secondary | ICD-10-CM | POA: Diagnosis not present

## 2018-05-25 DIAGNOSIS — N3946 Mixed incontinence: Secondary | ICD-10-CM | POA: Diagnosis not present

## 2018-05-27 ENCOUNTER — Encounter: Payer: Self-pay | Admitting: *Deleted

## 2018-05-28 ENCOUNTER — Other Ambulatory Visit (HOSPITAL_COMMUNITY)
Admission: RE | Admit: 2018-05-28 | Discharge: 2018-05-28 | Disposition: A | Discharge status: Home / Self Care | Payer: BLUE CROSS/BLUE SHIELD | Source: Ambulatory Visit | Attending: Obstetrics & Gynecology | Admitting: Obstetrics & Gynecology

## 2018-05-28 ENCOUNTER — Encounter (INDEPENDENT_AMBULATORY_CARE_PROVIDER_SITE_OTHER): Payer: Self-pay

## 2018-05-28 ENCOUNTER — Ambulatory Visit: Payer: BLUE CROSS/BLUE SHIELD | Admitting: Women's Health

## 2018-05-28 ENCOUNTER — Other Ambulatory Visit: Payer: Self-pay

## 2018-05-28 ENCOUNTER — Encounter: Payer: Self-pay | Admitting: Women's Health

## 2018-05-28 VITALS — BP 117/81 | HR 77 | Ht 62.5 in | Wt 272.0 lb

## 2018-05-28 DIAGNOSIS — N3289 Other specified disorders of bladder: Secondary | ICD-10-CM | POA: Diagnosis not present

## 2018-05-28 DIAGNOSIS — R351 Nocturia: Secondary | ICD-10-CM | POA: Diagnosis not present

## 2018-05-28 DIAGNOSIS — Z01419 Encounter for gynecological examination (general) (routine) without abnormal findings: Secondary | ICD-10-CM | POA: Diagnosis not present

## 2018-05-28 DIAGNOSIS — N3946 Mixed incontinence: Secondary | ICD-10-CM | POA: Insufficient documentation

## 2018-05-28 DIAGNOSIS — E282 Polycystic ovarian syndrome: Secondary | ICD-10-CM | POA: Insufficient documentation

## 2018-05-28 DIAGNOSIS — N941 Unspecified dyspareunia: Secondary | ICD-10-CM | POA: Insufficient documentation

## 2018-05-28 DIAGNOSIS — N898 Other specified noninflammatory disorders of vagina: Secondary | ICD-10-CM | POA: Insufficient documentation

## 2018-05-28 MED ORDER — SOLIFENACIN SUCCINATE 10 MG PO TABS: 10.0000 mg | ORAL_TABLET | Freq: Every day | ORAL | 11 refills | Status: DC

## 2018-05-28 NOTE — Progress Notes (Signed)
Pap smear Patient name: Mary Paul MRN [id number]  Date of birth: 07/14/1970 Chief Complaint:   Gynecologic Exam (PCP did physical)  History of Present Illness:   Mary Paul is a 47 y.o. 901P1001 Caucasian female being seen today for a pap smear visit. PCP has done physical w/ breast exam.  Current complaints: bladder spasms, frequent urination, urgency x couple of years, has worsened lately. Reports she takes melatonin and progesterone at night, which she says helps her sleep until about midnight, then wakes up q 30-6445mins to void.  Also reports urinary leakage w/ coughing/sneezing, etc as well as mild urge incontinence. Taking ditropan, has helped 'only a little'. Sex is painful, causes bladder spasms, also has vaginal dryness. Uses otc lubricant which has helped some, but she thinks it may be contributing to the bladder irritation. Drinks 2 cups of coffee in am, then a 3rd when she gets to work. Knows this is also contributing to bladder irritation, but isn't sleeping well, so needs coffee. Left urine for culture w/ PCP on Monday, hasn't heard anything. Has PCOS, dx about 4147yrs ago. Periods are irregular, sometimes will skip 1 month, sometimes has 2 periods in 1 month. Periods vary in flow, last 5d, some cramping and small clots. Her periods do not bother her and she is not interested in doing anything to regulate them at this time.   PCP: Dr. Karilyn CotaGosrani      does not desire labs Patient's last menstrual period was 04/26/2018. The current method of family planning is tubal ligation Last pap >8219yrs ago. Results were: normal, reports has never had an abnormal Last mammogram: never. Results were: n/a. Family h/o breast cancer: No 'I haven't gotten one, I don't have any family h/o breast cancer' Last colonoscopy: never. Results were: n/a. Family h/o colorectal cancer: No Review of Systems:   Pertinent items are noted in HPI Denies any headaches, blurred vision, fatigue, shortness of breath,  chest pain, abdominal pain, abnormal vaginal discharge/itching/odor/irritation, problems with periods, bowel movements, urination, or intercourse unless otherwise stated above. Pertinent History Reviewed:  Reviewed past medical,surgical, social and family history.  Reviewed problem list, medications and allergies. Physical Assessment:   Vitals:   05/28/18 0907  BP: 117/81  Pulse: 77  Weight: 272 lb (123.4 kg)  Height: 5' 2.5" (1.588 m)  Body mass index is 48.96 kg/m.        Physical Examination:   General appearance - well appearing, and in no distress  Mental status - alert, oriented to person, place, and time  Psych:  She has a normal mood and affect  Skin - warm and dry, normal color, no suspicious lesions noted  Chest - breathing effort normal  Heart - normal rate  Pelvic - VULVA: normal appearing vulva with no masses, tenderness or lesions  VAGINA: normal appearing vagina with normal color and discharge, no lesions  CERVIX: normal appearing cervix without discharge or lesions, no CMT  Thin prep pap is done w/ HR HPV cotesting  UTERUS: exam limited by body habitus, nontender  ADNEXA: exam limited by body habitus, nontender  Extremities:  No swelling or varicosities noted  No results found for this or any previous visit (from the past 24 hour(s)).  Assessment & Plan:  1) Pap smear  2) Mixed urinary incontinence> discussed w/ JVF, stop ditropan, rx vesicare 10mg  daily (to take at supper-bedtime)  3) Bladder spasms w/ frequent nocturia> vesicare   4) Vaginal dryness w/ dyspareunia> continue otc lubricant, can also try [place]a  5) PCOS> irregular periods 2x/mth to q 2months. Not interested in doing anything to regulate periods  6) Past due for mammogram> pt to call [facility] to schedule, discussed importance of screening even w/ no family hx.   Labs/procedures today: pap  Mammogram-now  Colonoscopy @48yo  or sooner if problems  No orders of the defined types were placed  in this encounter.   Follow-up: Return in about 4 weeks (around 06/25/2018) for F/U w/ [facility]. in case sx persist/possible IC  Cheral Marker CNM, [facility] 05/28/2018 11:10 AM

## 2018-05-28 NOTE — Patient Instructions (Addendum)
Can try Luvena (over the counter) for vaginal dryness  Schedule your mammogram [phone] [facility]

## 2018-06-01 LAB — CYTOLOGY - PAP
Diagnosis: NEGATIVE
HPV: NOT DETECTED

## 2018-06-25 ENCOUNTER — Ambulatory Visit: Payer: BLUE CROSS/BLUE SHIELD | Admitting: Obstetrics & Gynecology

## 2018-07-02 ENCOUNTER — Other Ambulatory Visit: Payer: Self-pay

## 2018-07-02 ENCOUNTER — Encounter: Payer: Self-pay | Admitting: Obstetrics & Gynecology

## 2018-07-02 ENCOUNTER — Ambulatory Visit: Payer: BLUE CROSS/BLUE SHIELD | Admitting: Obstetrics & Gynecology

## 2018-07-02 VITALS — BP 119/81 | HR 78 | Ht 62.5 in | Wt 268.0 lb

## 2018-07-02 DIAGNOSIS — N3010 Interstitial cystitis (chronic) without hematuria: Secondary | ICD-10-CM

## 2018-07-02 MED ORDER — ESTROGENS, CONJUGATED 0.625 MG/GM VA CREA: 1.0000 | TOPICAL_CREAM | Freq: Every day | VAGINAL | 11 refills | Status: DC

## 2018-07-02 MED ORDER — URIBEL 118 MG PO CAPS: 1.0000 | ORAL_CAPSULE | Freq: Four times a day (QID) | ORAL | 2 refills | Status: DC
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
## 2018-07-06 ENCOUNTER — Telehealth: Payer: Self-pay | Admitting: *Deleted

## 2018-07-06 ENCOUNTER — Telehealth: Payer: Self-pay | Admitting: Obstetrics & Gynecology

## 2018-07-06 NOTE — Telephone Encounter (Signed)
Pt states that the uribel she was prescribed is going to cost $300. She states that she cant afford that and asks if there is another medication that can be sent in. She also states that the premarin cream is also going to cost $100. Offered pt a discount premarin card and told her that I would leave it at the front desk for her to pick up. Advised that we also have a good rx card that she could try with the uribel, it will still cost around $140. Pt states that is still too high. Advised that I would send her request to a provider and let her know their recommendations. Pt verbalized understanding.

## 2018-07-06 NOTE — Telephone Encounter (Signed)
Pharmacy called regarding prescription for Premarin cream and the directions. Instructions are "1 applicator full".  Are you wanting 2gm daily? Please advise.

## 2018-07-06 NOTE — Telephone Encounter (Signed)
Please call pt to discuss

## 2018-07-06 NOTE — Telephone Encounter (Signed)
Please call to discuss her meds

## 2018-07-07 ENCOUNTER — Telehealth: Payer: Self-pay | Admitting: Obstetrics & Gynecology

## 2018-07-07 MED ORDER — ESTRADIOL 0.1 MG/GM VA CREA: TOPICAL_CREAM | VAGINAL | 12 refills | Status: DC

## 2018-07-07 NOTE — Telephone Encounter (Signed)
I sent a script in for estrace, not sure if it is cheaper for her but we will try

## 2018-07-07 NOTE — Telephone Encounter (Signed)
Informed pt, per Dr Despina Hidden, there is not a prescription alternative for uribel. Advised that she could try taking OTC pyridium, 2 daily, to see if that will help. Advised that this is not a long term solution, but should help until they can come up with another plan. Advised that a different prescription for vaginal cream was sent in and she could see if that price works better for her. Pt verbalized understanding.

## 2018-07-09 DIAGNOSIS — F330 Major depressive disorder, recurrent, mild: Secondary | ICD-10-CM | POA: Diagnosis not present

## 2018-07-09 DIAGNOSIS — E039 Hypothyroidism, unspecified: Secondary | ICD-10-CM | POA: Diagnosis not present

## 2018-07-09 DIAGNOSIS — E282 Polycystic ovarian syndrome: Secondary | ICD-10-CM | POA: Diagnosis not present

## 2018-07-09 DIAGNOSIS — Z713 Dietary counseling and surveillance: Secondary | ICD-10-CM | POA: Diagnosis not present

## 2018-07-13 ENCOUNTER — Encounter: Payer: Self-pay | Admitting: Obstetrics & Gynecology

## 2018-07-24 ENCOUNTER — Ambulatory Visit: Payer: BLUE CROSS/BLUE SHIELD | Admitting: Obstetrics & Gynecology

## 2018-07-29 DIAGNOSIS — J019 Acute sinusitis, unspecified: Secondary | ICD-10-CM | POA: Diagnosis not present

## 2018-08-18 DIAGNOSIS — I10 Essential (primary) hypertension: Secondary | ICD-10-CM | POA: Diagnosis not present

## 2018-08-18 DIAGNOSIS — R6882 Decreased libido: Secondary | ICD-10-CM | POA: Diagnosis not present

## 2018-08-18 DIAGNOSIS — E282 Polycystic ovarian syndrome: Secondary | ICD-10-CM | POA: Diagnosis not present

## 2018-09-17 ENCOUNTER — Encounter (INDEPENDENT_AMBULATORY_CARE_PROVIDER_SITE_OTHER): Payer: Self-pay | Admitting: Internal Medicine

## 2018-09-24 ENCOUNTER — Ambulatory Visit (INDEPENDENT_AMBULATORY_CARE_PROVIDER_SITE_OTHER): Payer: BLUE CROSS/BLUE SHIELD | Admitting: Internal Medicine

## 2018-10-21 DIAGNOSIS — E039 Hypothyroidism, unspecified: Secondary | ICD-10-CM | POA: Diagnosis not present

## 2018-10-21 DIAGNOSIS — G4700 Insomnia, unspecified: Secondary | ICD-10-CM | POA: Diagnosis not present

## 2018-10-21 DIAGNOSIS — I10 Essential (primary) hypertension: Secondary | ICD-10-CM | POA: Diagnosis not present

## 2018-10-21 DIAGNOSIS — R6882 Decreased libido: Secondary | ICD-10-CM | POA: Diagnosis not present

## 2018-10-21 DIAGNOSIS — R5383 Other fatigue: Secondary | ICD-10-CM | POA: Diagnosis not present

## 2018-10-28 DIAGNOSIS — E039 Hypothyroidism, unspecified: Secondary | ICD-10-CM | POA: Diagnosis not present

## 2018-10-28 DIAGNOSIS — R5383 Other fatigue: Secondary | ICD-10-CM | POA: Diagnosis not present

## 2018-12-16 IMAGING — CT CT RENAL STONE PROTOCOL
2 of 4 series · 16 of 46 positions shown, 18 images · non-contrast
Comparison: None.

CLINICAL DATA: 46-year-old female with right flank pain. Concern
for kidney stone.

EXAM:
CT ABDOMEN AND PELVIS WITHOUT CONTRAST
TECHNIQUE: Multidetector CT imaging of the abdomen and pelvis was performed
following the standard protocol without IV contrast.

[Series 2: axial st · axial · 0.98mm/px · z∈[-632,-186]mm · 13 of 97 slices shown, 15 images]
[im 4/97  soft-tissue]
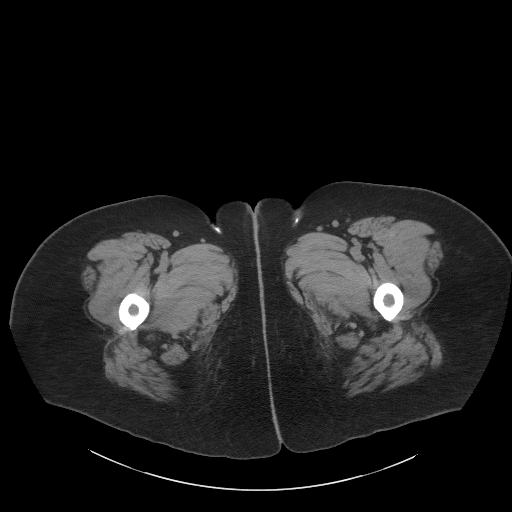
[im 4/97  bone]
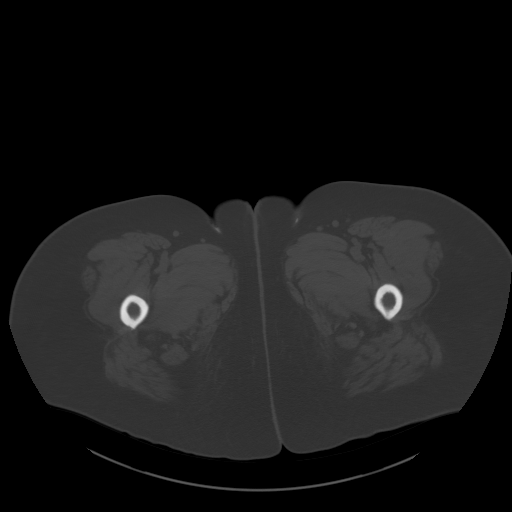
[im 12/97  soft-tissue]
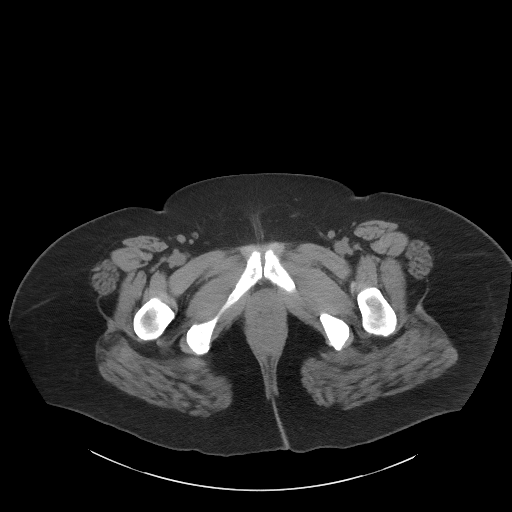
[im 20/97  soft-tissue]
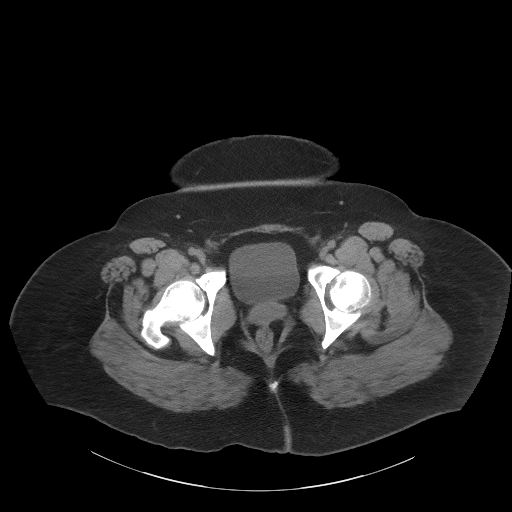
[im 27/97  soft-tissue]
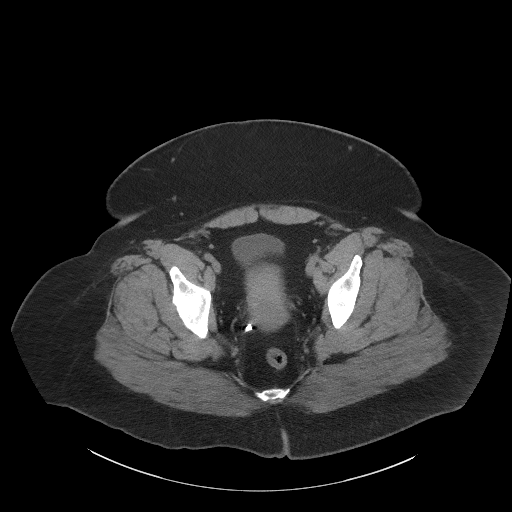
[im 35/97  soft-tissue]
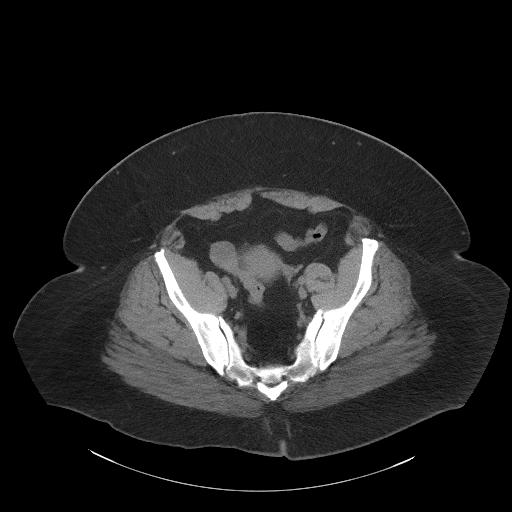
[im 43/97  soft-tissue]
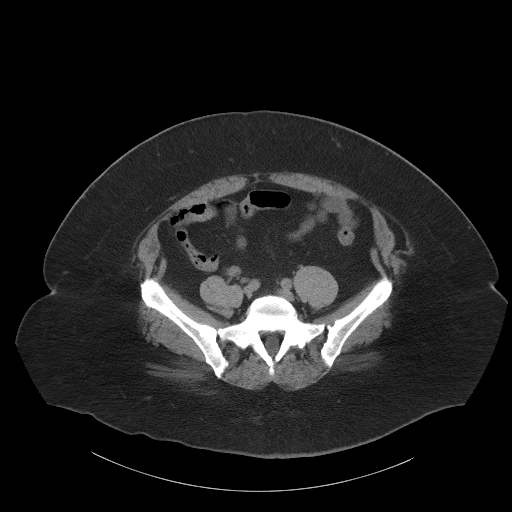
[im 50/97  soft-tissue]
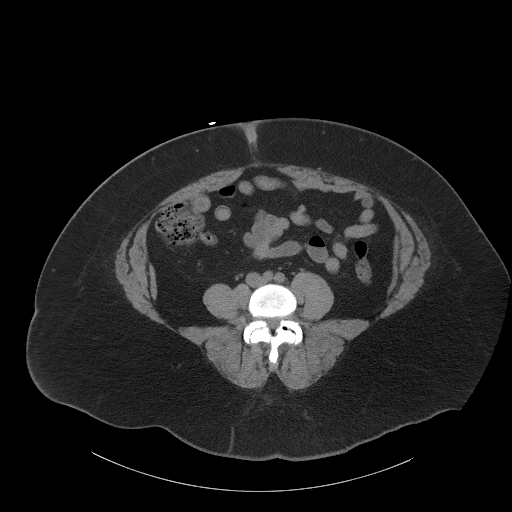
[im 54/97  soft-tissue]
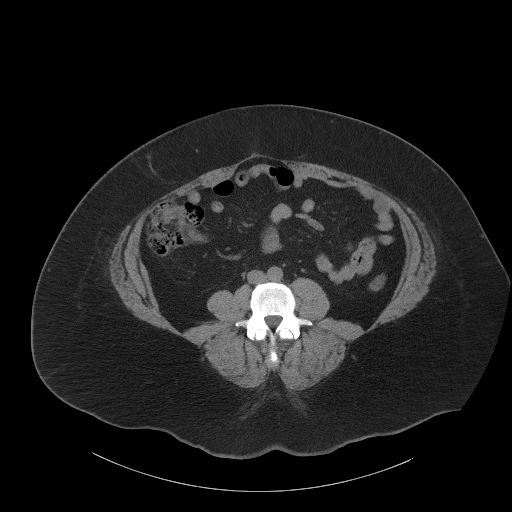
[im 62/97  soft-tissue]
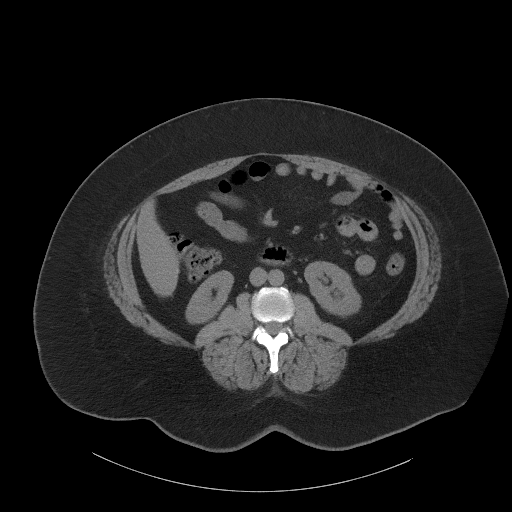
[im 62/97  bone]
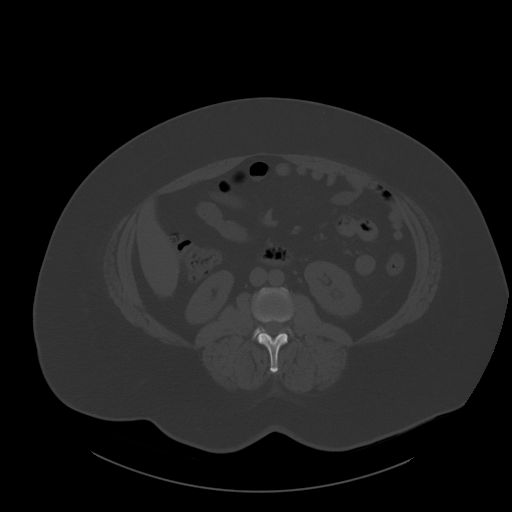
[im 70/97  soft-tissue]
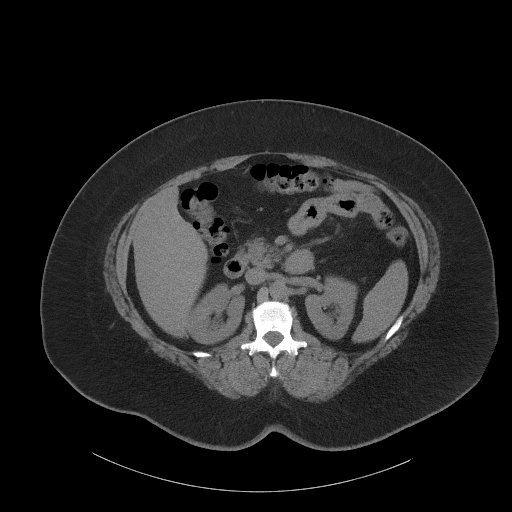
[im 77/97  soft-tissue]
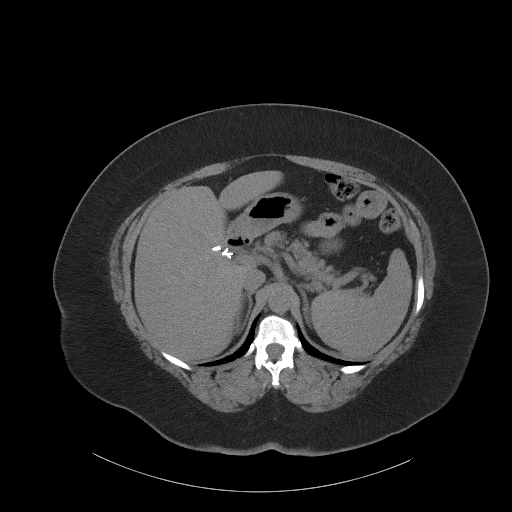
[im 85/97  soft-tissue]
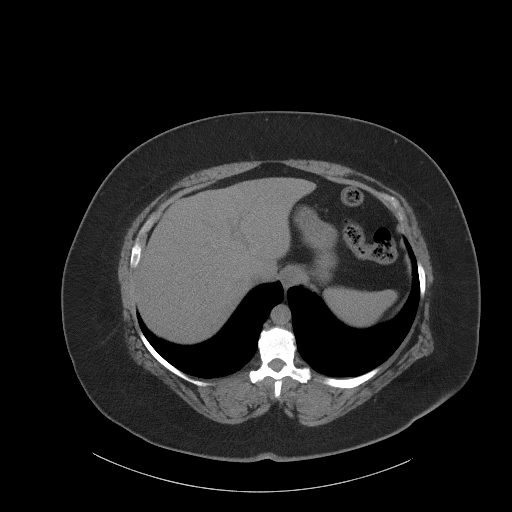
[im 93/97  soft-tissue]
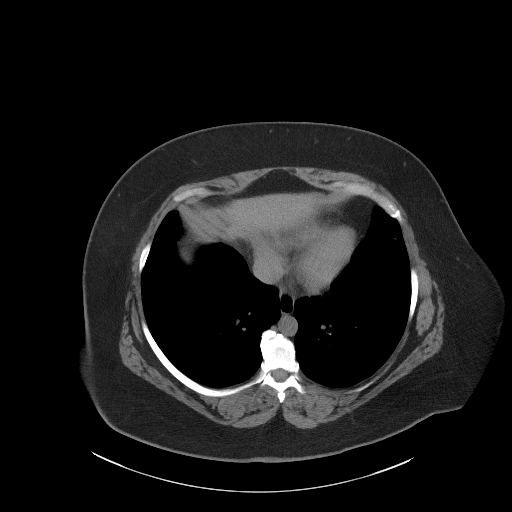

[Series 5: coronal st · coronal · 0.81mm/px · 3 of 101 slices shown]
[im 34/101  soft-tissue]
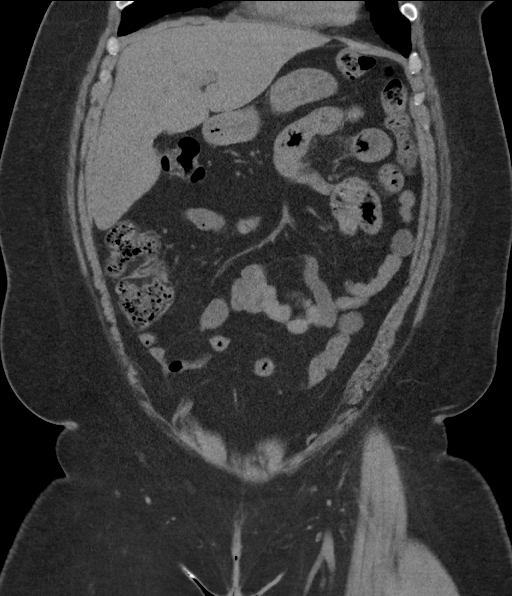
[im 45/101  soft-tissue]
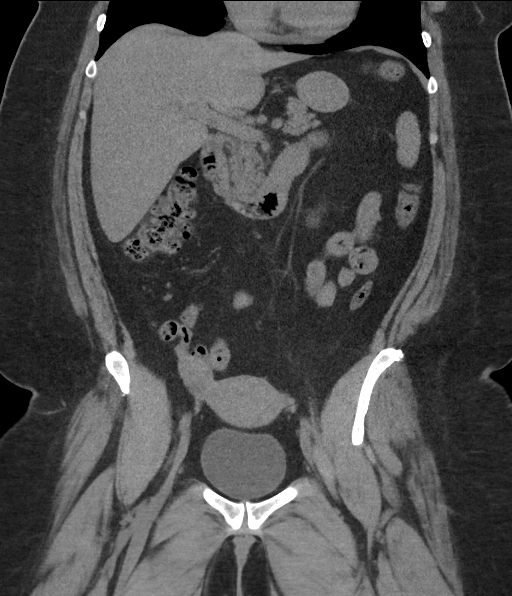
[im 56/101  soft-tissue]
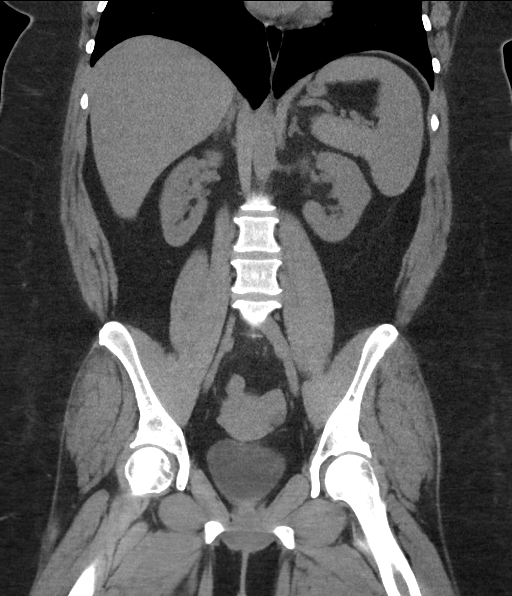

[16 of 46 positions shown; findings below may reference images not displayed]

FINDINGS: Evaluation of this exam is limited in the absence of intravenous
contrast.

Lower chest: The visualized lung bases are clear.

No intra-abdominal free air or free fluid.

Hepatobiliary: There is diffuse fatty infiltration of the liver. No
intrahepatic biliary ductal dilatation. Cholecystectomy.

Pancreas: Unremarkable. No pancreatic ductal dilatation or
surrounding inflammatory changes.

Spleen: Normal in size without focal abnormality.

Adrenals/Urinary Tract: The adrenal glands are unremarkable. There
is no hydronephrosis or nephrolithiasis on either side.
Subcentimeter right renal inferior pole hypodense lesion is not
characterized. The visualized ureters and urinary bladder appear
unremarkable.

Stomach/Bowel: There is no bowel obstruction or active inflammation.
Normal appendix.

Vascular/Lymphatic: The abdominal aorta and IVC are grossly
unremarkable on this noncontrast CT. No portal venous gas. There is
no adenopathy.

Reproductive: The uterus is anteverted and grossly unremarkable. The
ovaries are grossly unremarkable. Bilateral dominant follicles
measure 2.3 cm on the right. Two tubal ligation clips noted in the
posterior pelvis, one in the midline and the other in the right
hemipelvis.

Other: None

Musculoskeletal: Mild degenerative changes of the spine. Lower
lumbar facet arthropathy. No acute osseous pathology. Sclerotic
lesion at the right acetabular pubic junction without bone erosion
or periosteal reaction appears nonaggressive and may represent a
bone island.
IMPRESSION: 1. No acute intra-abdominal or pelvic pathology. No hydronephrosis
or nephrolithiasis.
2. Mild fatty liver.
3. No bowel obstruction or active inflammation.  Normal appendix.

## 2018-12-28 ENCOUNTER — Ambulatory Visit (HOSPITAL_COMMUNITY): Payer: BC Managed Care – PPO

## 2019-02-04 DIAGNOSIS — E282 Polycystic ovarian syndrome: Secondary | ICD-10-CM | POA: Diagnosis not present

## 2019-02-04 DIAGNOSIS — E039 Hypothyroidism, unspecified: Secondary | ICD-10-CM | POA: Diagnosis not present

## 2019-02-28 ENCOUNTER — Other Ambulatory Visit (INDEPENDENT_AMBULATORY_CARE_PROVIDER_SITE_OTHER): Payer: Self-pay | Admitting: Internal Medicine

## 2019-03-05 ENCOUNTER — Other Ambulatory Visit (INDEPENDENT_AMBULATORY_CARE_PROVIDER_SITE_OTHER): Payer: Self-pay | Admitting: Internal Medicine

## 2019-03-25 ENCOUNTER — Encounter (INDEPENDENT_AMBULATORY_CARE_PROVIDER_SITE_OTHER): Payer: Self-pay | Admitting: Internal Medicine

## 2019-03-25 ENCOUNTER — Other Ambulatory Visit: Payer: Self-pay

## 2019-03-25 ENCOUNTER — Ambulatory Visit (INDEPENDENT_AMBULATORY_CARE_PROVIDER_SITE_OTHER): Payer: BC Managed Care – PPO | Admitting: Internal Medicine

## 2019-03-25 VITALS — BP 150/90 | HR 80 | Ht 63.0 in | Wt 267.0 lb

## 2019-03-25 DIAGNOSIS — E890 Postprocedural hypothyroidism: Secondary | ICD-10-CM | POA: Diagnosis not present

## 2019-03-25 DIAGNOSIS — F419 Anxiety disorder, unspecified: Secondary | ICD-10-CM

## 2019-03-25 DIAGNOSIS — I10 Essential (primary) hypertension: Secondary | ICD-10-CM | POA: Insufficient documentation

## 2019-03-25 DIAGNOSIS — N951 Menopausal and female climacteric states: Secondary | ICD-10-CM

## 2019-03-25 DIAGNOSIS — E282 Polycystic ovarian syndrome: Secondary | ICD-10-CM

## 2019-03-25 DIAGNOSIS — F5101 Primary insomnia: Secondary | ICD-10-CM

## 2019-03-25 HISTORY — DX: Essential (primary) hypertension: I10

## 2019-03-25 MED ORDER — OMEPRAZOLE 40 MG PO CPDR: 40.0000 mg | DELAYED_RELEASE_CAPSULE | Freq: Every day | ORAL | 3 refills | Status: DC
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
## 2019-03-26 ENCOUNTER — Other Ambulatory Visit (INDEPENDENT_AMBULATORY_CARE_PROVIDER_SITE_OTHER): Payer: Self-pay | Admitting: Internal Medicine

## 2019-03-26 LAB — PROGESTERONE: Progesterone: 72.7 ng/mL

## 2019-03-26 LAB — T3, FREE: T3, Free: 2.6 pg/mL (ref 2.3–4.2)

## 2019-03-26 LAB — TSH: TSH: 0.33 m[IU]/L — ABNORMAL LOW

## 2019-03-26 MED ORDER — THYROID 120 MG PO TABS: 120.0000 mg | ORAL_TABLET | Freq: Two times a day (BID) | ORAL | 3 refills | Status: DC

## 2019-03-27 ENCOUNTER — Other Ambulatory Visit (INDEPENDENT_AMBULATORY_CARE_PROVIDER_SITE_OTHER): Payer: Self-pay | Admitting: Internal Medicine

## 2019-04-29 ENCOUNTER — Telehealth (INDEPENDENT_AMBULATORY_CARE_PROVIDER_SITE_OTHER): Payer: Self-pay

## 2019-04-29 ENCOUNTER — Ambulatory Visit: Payer: Self-pay | Admitting: Psychiatry

## 2019-04-29 ENCOUNTER — Other Ambulatory Visit: Payer: Self-pay

## 2019-04-29 DIAGNOSIS — F419 Anxiety disorder, unspecified: Secondary | ICD-10-CM | POA: Diagnosis not present

## 2019-04-29 DIAGNOSIS — E039 Hypothyroidism, unspecified: Secondary | ICD-10-CM | POA: Diagnosis not present

## 2019-04-29 DIAGNOSIS — R0602 Shortness of breath: Secondary | ICD-10-CM | POA: Diagnosis not present

## 2019-04-29 DIAGNOSIS — R6883 Chills (without fever): Secondary | ICD-10-CM | POA: Diagnosis not present

## 2019-04-29 DIAGNOSIS — Z20828 Contact with and (suspected) exposure to other viral communicable diseases: Secondary | ICD-10-CM | POA: Diagnosis not present

## 2019-04-29 DIAGNOSIS — Z881 Allergy status to other antibiotic agents status: Secondary | ICD-10-CM | POA: Diagnosis not present

## 2019-04-29 DIAGNOSIS — R0789 Other chest pain: Secondary | ICD-10-CM | POA: Diagnosis not present

## 2019-04-29 DIAGNOSIS — R519 Headache, unspecified: Secondary | ICD-10-CM | POA: Diagnosis not present

## 2019-04-29 DIAGNOSIS — Z79899 Other long term (current) drug therapy: Secondary | ICD-10-CM | POA: Diagnosis not present

## 2019-04-29 DIAGNOSIS — R067 Sneezing: Secondary | ICD-10-CM | POA: Diagnosis not present

## 2019-04-29 DIAGNOSIS — R05 Cough: Secondary | ICD-10-CM | POA: Diagnosis not present

## 2019-04-29 DIAGNOSIS — R079 Chest pain, unspecified: Secondary | ICD-10-CM | POA: Diagnosis not present

## 2019-04-29 DIAGNOSIS — F17200 Nicotine dependence, unspecified, uncomplicated: Secondary | ICD-10-CM | POA: Diagnosis not present

## 2019-04-29 DIAGNOSIS — Z880 Allergy status to penicillin: Secondary | ICD-10-CM | POA: Diagnosis not present

## 2019-04-29 NOTE — Telephone Encounter (Signed)
Sent records via HIM to Dr Maurie Boettcher [facility] Provider Release:  [phone]

## 2019-04-29 NOTE — Telephone Encounter (Signed)
Called pt to notify of referral status.

## 2019-05-12 ENCOUNTER — Encounter (INDEPENDENT_AMBULATORY_CARE_PROVIDER_SITE_OTHER): Payer: BC Managed Care – PPO | Admitting: Internal Medicine

## 2019-05-19 ENCOUNTER — Encounter: Payer: Self-pay | Admitting: Psychiatry

## 2019-05-19 ENCOUNTER — Ambulatory Visit (INDEPENDENT_AMBULATORY_CARE_PROVIDER_SITE_OTHER): Payer: BC Managed Care – PPO | Admitting: Psychiatry

## 2019-05-19 ENCOUNTER — Other Ambulatory Visit: Payer: Self-pay

## 2019-05-19 DIAGNOSIS — F3341 Major depressive disorder, recurrent, in partial remission: Secondary | ICD-10-CM

## 2019-05-19 HISTORY — DX: Major depressive disorder, recurrent, in partial remission: F33.41

## 2019-05-19 MED ORDER — BUPROPION HCL ER (XL) 150 MG PO TB24
150.0000 mg | ORAL_TABLET | ORAL | 1 refills | Status: DC
Start: 2019-05-19 — End: 2019-07-09

## 2019-05-19 MED ORDER — TRAZODONE HCL 50 MG PO TABS: 50.0000 mg | ORAL_TABLET | Freq: Every day | ORAL | 1 refills | Status: DC

## 2019-05-19 MED ORDER — CITALOPRAM HYDROBROMIDE 40 MG PO TABS: 40.0000 mg | ORAL_TABLET | Freq: Every day | ORAL | 1 refills | Status: DC
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
## 2019-05-24 ENCOUNTER — Telehealth: Payer: Self-pay

## 2019-05-24 NOTE — Telephone Encounter (Signed)
Please tell her to try taking 2 tablets of Trazodone at bedtime to see if that helps with insomnia.

## 2019-05-24 NOTE — Telephone Encounter (Signed)
pt left message that she is not sleeping and that her mind is racing.  pt states she wakes up at 2:00 and can not get back to sleep

## 2019-05-25 ENCOUNTER — Telehealth: Payer: Self-pay

## 2019-05-25 MED ORDER — DOXEPIN HCL 50 MG PO CAPS: 50.0000 mg | ORAL_CAPSULE | Freq: Every day | ORAL | 1 refills | Status: DC

## 2019-05-25 NOTE — Telephone Encounter (Signed)
Left message for patient. With medication change

## 2019-05-25 NOTE — Telephone Encounter (Signed)
Pt called office back and she states she had already tried taking the two tablets and it doesn't help . She needs to try something else

## 2019-05-25 NOTE — Addendum Note (Signed)
Addended by: [place] Crane on: 05/25/2019 11:57 AM   Modules accepted: Orders

## 2019-05-25 NOTE — Telephone Encounter (Signed)
Pt called office back and she states she had already tried taking the two tablets and it doesn't help . She needs to try something else.  Had to create a new message

## 2019-05-25 NOTE — Telephone Encounter (Signed)
Okay, I am sending a prescription for Doxepin 50 mg at bedtime for sleep. That is another commonly prescribed sleeping medication.

## 2019-05-25 NOTE — Telephone Encounter (Signed)
Left message to call office back

## 2019-06-15 ENCOUNTER — Other Ambulatory Visit: Payer: Self-pay | Admitting: Women's Health

## 2019-06-15 ENCOUNTER — Other Ambulatory Visit (INDEPENDENT_AMBULATORY_CARE_PROVIDER_SITE_OTHER): Payer: Self-pay | Admitting: Internal Medicine

## 2019-06-19 ENCOUNTER — Other Ambulatory Visit (INDEPENDENT_AMBULATORY_CARE_PROVIDER_SITE_OTHER): Payer: Self-pay | Admitting: Internal Medicine

## 2019-06-19 DIAGNOSIS — Z1231 Encounter for screening mammogram for malignant neoplasm of breast: Secondary | ICD-10-CM

## 2019-07-09 ENCOUNTER — Encounter: Payer: Self-pay | Admitting: Psychiatry

## 2019-07-09 ENCOUNTER — Other Ambulatory Visit: Payer: Self-pay

## 2019-07-09 ENCOUNTER — Ambulatory Visit (INDEPENDENT_AMBULATORY_CARE_PROVIDER_SITE_OTHER): Payer: BC Managed Care – PPO | Admitting: Psychiatry

## 2019-07-09 DIAGNOSIS — F3341 Major depressive disorder, recurrent, in partial remission: Secondary | ICD-10-CM

## 2019-07-09 MED ORDER — BUPROPION HCL ER (XL) 300 MG PO TB24: 300.0000 mg | ORAL_TABLET | ORAL | 1 refills | Status: DC

## 2019-07-09 MED ORDER — CITALOPRAM HYDROBROMIDE 40 MG PO TABS: 40.0000 mg | ORAL_TABLET | Freq: Every day | ORAL | 1 refills | Status: DC

## 2019-07-09 MED ORDER — TEMAZEPAM 15 MG PO CAPS: 15.0000 mg | ORAL_CAPSULE | Freq: Every evening | ORAL | 1 refills | Status: DC | PRN
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
## 2019-07-12 ENCOUNTER — Ambulatory Visit: Payer: BC Managed Care – PPO | Admitting: Psychiatry

## 2019-07-28 ENCOUNTER — Telehealth: Payer: Self-pay

## 2019-07-28 MED ORDER — TEMAZEPAM 30 MG PO CAPS: 30.0000 mg | ORAL_CAPSULE | Freq: Every evening | ORAL | 0 refills | Status: DC | PRN

## 2019-07-28 NOTE — Telephone Encounter (Signed)
Patient called regarding her Temazepam 15mg . She stated that she needs 2 capsules to sleep. She stated that because she's taking 2 as opposed to 1, she needs double the dose. Please review and advise. Thank you.

## 2019-07-28 NOTE — Telephone Encounter (Signed)
Okay, new prescription for Temazepam 30 mg Hs sent to her pharmacy.

## 2019-08-02 ENCOUNTER — Telehealth: Payer: Self-pay

## 2019-08-02 NOTE — Telephone Encounter (Signed)
Medication managment - Telephone call with patient after she left a message questioning if an increase in her Restoril had been granted as her voicemail had been down.  Informed Dr. Evelene Croon sent in a new order for 30 mg on 07/28/19 to her [facility] in [place].

## 2019-08-03 ENCOUNTER — Other Ambulatory Visit (INDEPENDENT_AMBULATORY_CARE_PROVIDER_SITE_OTHER): Payer: Self-pay | Admitting: Internal Medicine

## 2019-08-09 ENCOUNTER — Ambulatory Visit (INDEPENDENT_AMBULATORY_CARE_PROVIDER_SITE_OTHER): Payer: BC Managed Care – PPO | Admitting: Nurse Practitioner

## 2019-08-09 DIAGNOSIS — F3341 Major depressive disorder, recurrent, in partial remission: Secondary | ICD-10-CM

## 2019-08-09 DIAGNOSIS — R05 Cough: Secondary | ICD-10-CM

## 2019-08-09 DIAGNOSIS — R059 Cough, unspecified: Secondary | ICD-10-CM

## 2019-08-09 DIAGNOSIS — I10 Essential (primary) hypertension: Secondary | ICD-10-CM | POA: Diagnosis not present

## 2019-08-09 HISTORY — DX: Cough, unspecified: R05.9

## 2019-08-09 MED ORDER — AMLODIPINE BESYLATE 5 MG PO TABS: 5.0000 mg | ORAL_TABLET | Freq: Every day | ORAL | 0 refills | Status: DC

## 2019-08-09 NOTE — Patient Instructions (Signed)
Thank you for choosing [facility] as your medical provider! If you have any questions or concerns regarding your health care, please do not hesitate to call our office.  Please continue all medication as prescribed.  Consider getting tested for Covid as we discussed in your office visit.  Remember to stay at home and quarantine as much as possible.  If you need to go out please make sure that you wear a mask that covers both your nose and mouth.  Once you are tested if your results come back positive, encourage your close contacts to be tested as well.  If your symptoms worsen especially if you experience worse shortness of breath or coughing please proceed to the emergency department.  If you choose not to get tested for Covid you must quarantine for at least 14 days from symptom onset, and should not leave the home unless 14 days have passed and your symptoms are improving.  If you do get tested for Covid and your test comes back positive, you need to quarantine for 10 days since symptom onset and to be without a fever for at least 3 days without needing to use ibuprofen or Tylenol to control your fever before you can finish quarantine.  Please follow-up as scheduled in 6 weeks. We look forward to seeing you again soon!   At [facility] we value your feedback. You may receive a survey about your visit today. Please share your experience as we strive to create trusting relationships with our patients to provide genuine, compassionate, quality care.  We appreciate your understanding and patience as we review any laboratory studies, imaging, and other diagnostic tests that are ordered as we care for you. We do our best to address any and all results in a timely manner. If you do not hear about test results within 1 week, please do not hesitate to contact us. If we referred you to a specialist during your visit or ordered imaging testing, contact the office if you have not been contacted  to be scheduled within 1 weeks.  We also encourage the use of MyChart, which contains your medical information for your review as well. If you are not enrolled in this feature, an access code is on this after visit summary for your convenience. Thank you for allowing [place] to be involved in your care.

## 2019-08-09 NOTE — Assessment & Plan Note (Signed)
Because the pandemic is still active, I told her that she really should consider being tested for COVID-19.  Thankfully her symptoms appear to be mild.  I gave her a phone number to call so she can get scheduled for testing through [facility] health or she can proceed to a [facility] near to her home.  I told her she must quarantine at home until her results are back.  I told her she must wear a mask if she absolutely has to leave her home.  I told her the current CDC guidelines regarding ending quarantine.  I also told her if her symptoms worsen in any way that she needs to proceed to the emergency department right away for evaluation and treatment.  She tells me she understands.  I encouraged her to discuss this with her close contacts as well as they may want to be tested especially if her results come back positive.  She tells me she understands.

## 2019-08-09 NOTE — Assessment & Plan Note (Addendum)
I have refilled her amlodipine she will continue on this medication for the time being.  She will follow-up in approximately 6 weeks at which time we will do her annual physical exam as well as check her blood pressure.

## 2019-08-09 NOTE — Assessment & Plan Note (Signed)
I encouraged her to continue her medication as prescribed by her psychiatrist.  I also encouraged her to follow-up with psychiatry as scheduled later this month.
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
## 2019-08-16 ENCOUNTER — Telehealth (INDEPENDENT_AMBULATORY_CARE_PROVIDER_SITE_OTHER): Payer: Self-pay | Admitting: Internal Medicine

## 2019-08-16 ENCOUNTER — Encounter (INDEPENDENT_AMBULATORY_CARE_PROVIDER_SITE_OTHER): Payer: Self-pay | Admitting: Internal Medicine

## 2019-08-16 NOTE — Telephone Encounter (Signed)
I agree

## 2019-08-17 ENCOUNTER — Other Ambulatory Visit (INDEPENDENT_AMBULATORY_CARE_PROVIDER_SITE_OTHER): Payer: Self-pay | Admitting: Internal Medicine

## 2019-08-17 ENCOUNTER — Telehealth (INDEPENDENT_AMBULATORY_CARE_PROVIDER_SITE_OTHER): Payer: Self-pay

## 2019-08-17 NOTE — Telephone Encounter (Signed)
Mary Paul is out of work due to [employer] and a letter was done for her to go back to work, now her daughter this morning started with fever and symptoms, Tamber is asking does she still need to stay out of work or is she ok to go back, please advise?

## 2019-08-17 NOTE — Telephone Encounter (Signed)
Noted. Patient is aware. 

## 2019-08-17 NOTE — Telephone Encounter (Signed)
Mary Paul should be okay to go back to work.Thanks.

## 2019-08-25 ENCOUNTER — Encounter: Payer: Self-pay | Admitting: Psychiatry

## 2019-08-25 ENCOUNTER — Other Ambulatory Visit: Payer: Self-pay

## 2019-08-25 ENCOUNTER — Ambulatory Visit (INDEPENDENT_AMBULATORY_CARE_PROVIDER_SITE_OTHER): Payer: BC Managed Care – PPO | Admitting: Psychiatry

## 2019-08-25 DIAGNOSIS — F3341 Major depressive disorder, recurrent, in partial remission: Secondary | ICD-10-CM

## 2019-08-25 DIAGNOSIS — F3342 Major depressive disorder, recurrent, in full remission: Secondary | ICD-10-CM

## 2019-08-25 MED ORDER — BUPROPION HCL ER (XL) 300 MG PO TB24: 300.0000 mg | ORAL_TABLET | ORAL | 2 refills | Status: DC

## 2019-08-25 MED ORDER — TEMAZEPAM 30 MG PO CAPS: 30.0000 mg | ORAL_CAPSULE | Freq: Every evening | ORAL | 2 refills | Status: DC | PRN

## 2019-08-25 MED ORDER — CITALOPRAM HYDROBROMIDE 40 MG PO TABS: 40.0000 mg | ORAL_TABLET | Freq: Every day | ORAL | 2 refills | Status: DC
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
## 2019-08-26 ENCOUNTER — Other Ambulatory Visit (INDEPENDENT_AMBULATORY_CARE_PROVIDER_SITE_OTHER): Payer: Self-pay | Admitting: Internal Medicine

## 2019-08-26 NOTE — Telephone Encounter (Signed)
refill 

## 2019-09-20 ENCOUNTER — Other Ambulatory Visit (INDEPENDENT_AMBULATORY_CARE_PROVIDER_SITE_OTHER): Payer: Self-pay | Admitting: Internal Medicine

## 2019-09-21 ENCOUNTER — Encounter (INDEPENDENT_AMBULATORY_CARE_PROVIDER_SITE_OTHER): Payer: BC Managed Care – PPO | Admitting: Nurse Practitioner

## 2019-09-29 ENCOUNTER — Telehealth (HOSPITAL_COMMUNITY): Payer: Self-pay | Admitting: *Deleted

## 2019-09-29 NOTE — Telephone Encounter (Signed)
Offer early appointment at 10 am or 11 am tomorrow morning to discuss further. Thanks.

## 2019-09-29 NOTE — Telephone Encounter (Signed)
Pt called with c/o increased anxiety and panic attacks. Pt is wondersing if medication can be "increased or changed". Pt has an upcoming appointment on 11/12/19. Please review and advise.

## 2019-09-30 ENCOUNTER — Ambulatory Visit (INDEPENDENT_AMBULATORY_CARE_PROVIDER_SITE_OTHER): Payer: Self-pay | Admitting: Nurse Practitioner

## 2019-09-30 ENCOUNTER — Encounter (INDEPENDENT_AMBULATORY_CARE_PROVIDER_SITE_OTHER): Payer: Self-pay | Admitting: Nurse Practitioner

## 2019-09-30 ENCOUNTER — Ambulatory Visit (INDEPENDENT_AMBULATORY_CARE_PROVIDER_SITE_OTHER): Payer: BC Managed Care – PPO | Admitting: Psychiatry

## 2019-09-30 ENCOUNTER — Other Ambulatory Visit: Payer: Self-pay

## 2019-09-30 ENCOUNTER — Encounter: Payer: Self-pay | Admitting: Psychiatry

## 2019-09-30 VITALS — BP 140/90 | HR 103 | Temp 97.4°F | Ht 62.0 in | Wt 274.8 lb

## 2019-09-30 DIAGNOSIS — F419 Anxiety disorder, unspecified: Secondary | ICD-10-CM

## 2019-09-30 DIAGNOSIS — Z131 Encounter for screening for diabetes mellitus: Secondary | ICD-10-CM | POA: Diagnosis not present

## 2019-09-30 DIAGNOSIS — E890 Postprocedural hypothyroidism: Secondary | ICD-10-CM | POA: Diagnosis not present

## 2019-09-30 DIAGNOSIS — F3342 Major depressive disorder, recurrent, in full remission: Secondary | ICD-10-CM | POA: Insufficient documentation

## 2019-09-30 DIAGNOSIS — Z139 Encounter for screening, unspecified: Secondary | ICD-10-CM

## 2019-09-30 DIAGNOSIS — E559 Vitamin D deficiency, unspecified: Secondary | ICD-10-CM

## 2019-09-30 DIAGNOSIS — Z862 Personal history of diseases of the blood and blood-forming organs and certain disorders involving the immune mechanism: Secondary | ICD-10-CM

## 2019-09-30 DIAGNOSIS — I10 Essential (primary) hypertension: Secondary | ICD-10-CM

## 2019-09-30 DIAGNOSIS — Z0001 Encounter for general adult medical examination with abnormal findings: Secondary | ICD-10-CM

## 2019-09-30 DIAGNOSIS — Z13220 Encounter for screening for lipoid disorders: Secondary | ICD-10-CM

## 2019-09-30 DIAGNOSIS — R5383 Other fatigue: Secondary | ICD-10-CM

## 2019-09-30 HISTORY — DX: Anxiety disorder, unspecified: F41.9

## 2019-09-30 HISTORY — DX: Major depressive disorder, recurrent, in full remission: F33.42

## 2019-09-30 MED ORDER — ESCITALOPRAM OXALATE 20 MG PO TABS: 20.0000 mg | ORAL_TABLET | Freq: Every day | ORAL | 1 refills | Status: DC
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
## 2019-10-01 LAB — HEMOGLOBIN A1C
Hgb A1c MFr Bld: 5.6 %{Hb} (ref <5.7)
Mean Plasma Glucose: 114 (calc)
eAG (mmol/L): 6.3 (calc)

## 2019-10-01 LAB — COMPLETE METABOLIC PANEL WITH GFR
AG Ratio: 1.7 (calc) (ref 1.0–2.5)
ALT: 22 U/L (ref 6–29)
AST: 16 U/L (ref 10–35)
Albumin: 4.1 g/dL (ref 3.6–5.1)
Alkaline phosphatase (APISO): 55 U/L (ref 31–125)
BUN: 14 mg/dL (ref 7–25)
CO2: 28 mmol/L (ref 20–32)
Calcium: 9.7 mg/dL (ref 8.6–10.2)
Chloride: 104 mmol/L (ref 98–110)
Creat: 1.06 mg/dL (ref 0.50–1.10)
GFR, Est African American: 71 mL/min/{1.73_m2} (ref >=60)
GFR, Est Non African American: 62 mL/min/{1.73_m2} (ref >=60)
Globulin: 2.4 g/dL (ref 1.9–3.7)
Glucose, Bld: 118 mg/dL — ABNORMAL HIGH (ref 65–99)
Potassium: 4.6 mmol/L (ref 3.5–5.3)
Sodium: 139 mmol/L (ref 135–146)
Total Bilirubin: 0.4 mg/dL (ref 0.2–1.2)
Total Protein: 6.5 g/dL (ref 6.1–8.1)

## 2019-10-01 LAB — IRON, TOTAL/TOTAL IRON BINDING CAP
%SAT: 17 % (ref 16–45)
Iron: 54 ug/dL (ref 40–190)
TIBC: 319 ug/dL (ref 250–450)

## 2019-10-01 LAB — CBC
HCT: 38.3 % (ref 35.0–45.0)
Hemoglobin: 12.8 g/dL (ref 11.7–15.5)
MCH: 28.2 pg (ref 27.0–33.0)
MCHC: 33.4 g/dL (ref 32.0–36.0)
MCV: 84.4 fL (ref 80.0–100.0)
MPV: 11.2 fL (ref 7.5–12.5)
Platelets: 271 10*3/uL (ref 140–400)
RBC: 4.54 10*6/uL (ref 3.80–5.10)
RDW: 12.8 % (ref 11.0–15.0)
WBC: 6.9 10*3/uL (ref 3.8–10.8)

## 2019-10-01 LAB — LIPID PANEL
Cholesterol: 180 mg/dL (ref <200)
HDL: 54 mg/dL (ref >=50)
LDL Cholesterol (Calc): 109 mg/dL — ABNORMAL HIGH
Non-HDL Cholesterol (Calc): 126 mg/dL (ref <130)
Total CHOL/HDL Ratio: 3.3 (calc) (ref <5.0)
Triglycerides: 82 mg/dL (ref <150)

## 2019-10-01 LAB — FERRITIN: Ferritin: 17 ng/mL (ref 16–232)

## 2019-10-01 LAB — TSH: TSH: 0.02 m[IU]/L — ABNORMAL LOW

## 2019-10-01 LAB — T3, FREE: T3, Free: 5.8 pg/mL — ABNORMAL HIGH (ref 2.3–4.2)

## 2019-10-01 LAB — T4, FREE: Free T4: 1.4 ng/dL (ref 0.8–1.8)

## 2019-10-01 LAB — VITAMIN D 25 HYDROXY (VIT D DEFICIENCY, FRACTURES): Vit D, 25-Hydroxy: 36 ng/mL (ref 30–100)

## 2019-10-04 ENCOUNTER — Other Ambulatory Visit (INDEPENDENT_AMBULATORY_CARE_PROVIDER_SITE_OTHER): Payer: Self-pay | Admitting: Nurse Practitioner

## 2019-10-04 MED ORDER — THYROID 60 MG PO TABS: 60.0000 mg | ORAL_TABLET | Freq: Every day | ORAL | 0 refills | Status: DC

## 2019-11-07 ENCOUNTER — Other Ambulatory Visit (INDEPENDENT_AMBULATORY_CARE_PROVIDER_SITE_OTHER): Payer: Self-pay | Admitting: Internal Medicine

## 2019-11-12 ENCOUNTER — Encounter: Payer: Self-pay | Admitting: Psychiatry

## 2019-11-12 ENCOUNTER — Other Ambulatory Visit: Payer: Self-pay

## 2019-11-12 ENCOUNTER — Telehealth (INDEPENDENT_AMBULATORY_CARE_PROVIDER_SITE_OTHER): Payer: Self-pay | Admitting: Psychiatry

## 2019-11-12 DIAGNOSIS — F3342 Major depressive disorder, recurrent, in full remission: Secondary | ICD-10-CM

## 2019-11-12 DIAGNOSIS — F419 Anxiety disorder, unspecified: Secondary | ICD-10-CM

## 2019-11-12 MED ORDER — TEMAZEPAM 30 MG PO CAPS: 30.0000 mg | ORAL_CAPSULE | Freq: Every evening | ORAL | 2 refills | Status: DC | PRN

## 2019-11-12 MED ORDER — ESCITALOPRAM OXALATE 20 MG PO TABS: 20.0000 mg | ORAL_TABLET | Freq: Every day | ORAL | 2 refills | Status: DC

## 2019-11-12 MED ORDER — BUPROPION HCL ER (XL) 300 MG PO TB24: 300.0000 mg | ORAL_TABLET | ORAL | 2 refills | Status: DC
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
## 2019-12-16 ENCOUNTER — Other Ambulatory Visit (INDEPENDENT_AMBULATORY_CARE_PROVIDER_SITE_OTHER): Payer: Self-pay | Admitting: Nurse Practitioner

## 2019-12-16 DIAGNOSIS — I10 Essential (primary) hypertension: Secondary | ICD-10-CM

## 2019-12-28 ENCOUNTER — Other Ambulatory Visit (INDEPENDENT_AMBULATORY_CARE_PROVIDER_SITE_OTHER): Payer: Self-pay | Admitting: Internal Medicine

## 2020-01-27 ENCOUNTER — Other Ambulatory Visit (INDEPENDENT_AMBULATORY_CARE_PROVIDER_SITE_OTHER): Payer: Self-pay | Admitting: Internal Medicine

## 2020-02-02 ENCOUNTER — Ambulatory Visit (INDEPENDENT_AMBULATORY_CARE_PROVIDER_SITE_OTHER): Payer: Self-pay | Admitting: Internal Medicine

## 2020-02-07 ENCOUNTER — Other Ambulatory Visit: Payer: Self-pay

## 2020-02-07 ENCOUNTER — Encounter (INDEPENDENT_AMBULATORY_CARE_PROVIDER_SITE_OTHER): Payer: Self-pay | Admitting: Nurse Practitioner

## 2020-02-07 ENCOUNTER — Ambulatory Visit (INDEPENDENT_AMBULATORY_CARE_PROVIDER_SITE_OTHER): Payer: BC Managed Care – PPO | Admitting: Nurse Practitioner

## 2020-02-07 VITALS — BP 155/95 | HR 97 | Temp 96.6°F | Ht 62.0 in | Wt 280.0 lb

## 2020-02-07 DIAGNOSIS — E782 Mixed hyperlipidemia: Secondary | ICD-10-CM

## 2020-02-07 DIAGNOSIS — R41840 Attention and concentration deficit: Secondary | ICD-10-CM

## 2020-02-07 DIAGNOSIS — E890 Postprocedural hypothyroidism: Secondary | ICD-10-CM

## 2020-02-07 DIAGNOSIS — I10 Essential (primary) hypertension: Secondary | ICD-10-CM | POA: Diagnosis not present

## 2020-02-07 MED ORDER — AMLODIPINE BESYLATE 10 MG PO TABS: 10.0000 mg | ORAL_TABLET | Freq: Every day | ORAL | 1 refills | Status: DC

## 2020-02-07 MED ORDER — THYROID 60 MG PO TABS: 120.0000 mg | ORAL_TABLET | Freq: Every day | ORAL | 0 refills | Status: DC
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
## 2020-02-15 ENCOUNTER — Telehealth (INDEPENDENT_AMBULATORY_CARE_PROVIDER_SITE_OTHER): Payer: BC Managed Care – PPO | Admitting: Psychiatry

## 2020-02-15 ENCOUNTER — Other Ambulatory Visit: Payer: Self-pay

## 2020-02-15 ENCOUNTER — Encounter (HOSPITAL_COMMUNITY): Payer: Self-pay | Admitting: Psychiatry

## 2020-02-15 DIAGNOSIS — F900 Attention-deficit hyperactivity disorder, predominantly inattentive type: Secondary | ICD-10-CM | POA: Diagnosis not present

## 2020-02-15 DIAGNOSIS — F419 Anxiety disorder, unspecified: Secondary | ICD-10-CM | POA: Diagnosis not present

## 2020-02-15 DIAGNOSIS — F3342 Major depressive disorder, recurrent, in full remission: Secondary | ICD-10-CM | POA: Diagnosis not present

## 2020-02-15 MED ORDER — AMPHETAMINE-DEXTROAMPHET ER 10 MG PO CP24: 10.0000 mg | ORAL_CAPSULE | Freq: Every morning | ORAL | 0 refills | Status: DC

## 2020-02-15 MED ORDER — TEMAZEPAM 30 MG PO CAPS: 30.0000 mg | ORAL_CAPSULE | Freq: Every evening | ORAL | 2 refills | Status: DC | PRN

## 2020-02-15 MED ORDER — ESCITALOPRAM OXALATE 20 MG PO TABS: 20.0000 mg | ORAL_TABLET | Freq: Every day | ORAL | 2 refills | Status: DC

## 2020-02-15 MED ORDER — BUPROPION HCL ER (XL) 300 MG PO TB24: 300.0000 mg | ORAL_TABLET | ORAL | 2 refills | Status: DC

## 2020-02-15 MED ORDER — AMPHETAMINE-DEXTROAMPHET ER 10 MG PO CP24: 10.0000 mg | ORAL_CAPSULE | ORAL | 0 refills | Status: DC
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
## 2020-02-27 ENCOUNTER — Other Ambulatory Visit (INDEPENDENT_AMBULATORY_CARE_PROVIDER_SITE_OTHER): Payer: Self-pay | Admitting: Nurse Practitioner

## 2020-03-07 ENCOUNTER — Encounter (INDEPENDENT_AMBULATORY_CARE_PROVIDER_SITE_OTHER): Payer: Self-pay | Admitting: Nurse Practitioner

## 2020-03-07 ENCOUNTER — Ambulatory Visit (INDEPENDENT_AMBULATORY_CARE_PROVIDER_SITE_OTHER): Payer: BC Managed Care – PPO | Admitting: Nurse Practitioner

## 2020-03-07 ENCOUNTER — Other Ambulatory Visit: Payer: Self-pay

## 2020-03-07 VITALS — BP 120/82 | HR 107 | Temp 96.9°F | Resp 16 | Ht 62.0 in | Wt 274.2 lb

## 2020-03-07 DIAGNOSIS — K219 Gastro-esophageal reflux disease without esophagitis: Secondary | ICD-10-CM

## 2020-03-07 DIAGNOSIS — F900 Attention-deficit hyperactivity disorder, predominantly inattentive type: Secondary | ICD-10-CM | POA: Diagnosis not present

## 2020-03-07 DIAGNOSIS — I10 Essential (primary) hypertension: Secondary | ICD-10-CM

## 2020-03-07 MED ORDER — OMEPRAZOLE 40 MG PO CPDR: 40.0000 mg | DELAYED_RELEASE_CAPSULE | Freq: Every day | ORAL | 1 refills | Status: DC | PRN
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
## 2020-03-28 ENCOUNTER — Other Ambulatory Visit (INDEPENDENT_AMBULATORY_CARE_PROVIDER_SITE_OTHER): Payer: Self-pay | Admitting: Internal Medicine

## 2020-04-04 ENCOUNTER — Other Ambulatory Visit (INDEPENDENT_AMBULATORY_CARE_PROVIDER_SITE_OTHER): Payer: Self-pay | Admitting: Internal Medicine

## 2020-04-13 ENCOUNTER — Telehealth (HOSPITAL_COMMUNITY): Payer: BC Managed Care – PPO | Admitting: Psychiatry

## 2020-04-13 ENCOUNTER — Other Ambulatory Visit: Payer: Self-pay

## 2020-04-17 ENCOUNTER — Telehealth (HOSPITAL_COMMUNITY): Payer: Self-pay | Admitting: *Deleted

## 2020-04-17 NOTE — Telephone Encounter (Signed)
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 WHICH REQUIRES AS PART OF EMPLOYMENT TO  NOT BE ON  NON- STIMULANT MEDICATIONS.  SO PATIENT IS REQUESTING THAT HER   amphetamine-dextroamphetamine (ADDERALL XR) 10 MG 24 hr capsule  & temazepam  (RESTORIL) 30 MG capsule  CHANGED TO A NON- STIMULANT BRAND/FORMULARY.  PATIENT REQUEST CALL LAST SEEN  02-15-20 NEXT APPT 05/10/21

## 2020-04-18 NOTE — Telephone Encounter (Signed)
Pt no-showed for her appt on 10/21. In order for all these med changes to be made we need to have a discussion during a scheduled appointment slot. I will discuss this with her at the time of her next appointment scheduled on 11/17.

## 2020-04-18 NOTE — Telephone Encounter (Signed)
Contacted Patient to Inform Per Provider: Dr Evelene Croon: Pt no-showed for her appt on 10/21. In order for all these med changes to be made we need to have a discussion during a scheduled appointment slot. I will discuss this with her at the time of her next appointment scheduled on 11/17.

## 2020-04-25 ENCOUNTER — Other Ambulatory Visit: Payer: Self-pay

## 2020-04-25 ENCOUNTER — Telehealth (INDEPENDENT_AMBULATORY_CARE_PROVIDER_SITE_OTHER): Payer: BC Managed Care – PPO | Admitting: Psychiatry

## 2020-04-25 ENCOUNTER — Encounter (HOSPITAL_COMMUNITY): Payer: Self-pay | Admitting: Psychiatry

## 2020-04-25 DIAGNOSIS — F3342 Major depressive disorder, recurrent, in full remission: Secondary | ICD-10-CM | POA: Diagnosis not present

## 2020-04-25 DIAGNOSIS — F900 Attention-deficit hyperactivity disorder, predominantly inattentive type: Secondary | ICD-10-CM

## 2020-04-25 DIAGNOSIS — F419 Anxiety disorder, unspecified: Secondary | ICD-10-CM

## 2020-04-25 MED ORDER — ESCITALOPRAM OXALATE 20 MG PO TABS: 20.0000 mg | ORAL_TABLET | Freq: Every day | ORAL | 2 refills | Status: DC

## 2020-04-25 MED ORDER — BELSOMRA 20 MG PO TABS: 20.0000 mg | ORAL_TABLET | Freq: Every day | ORAL | 2 refills | Status: DC

## 2020-04-25 MED ORDER — ATOMOXETINE HCL 25 MG PO CAPS: 25.0000 mg | ORAL_CAPSULE | Freq: Every morning | ORAL | 2 refills | Status: DC

## 2020-04-25 MED ORDER — BUPROPION HCL ER (XL) 300 MG PO TB24: 300.0000 mg | ORAL_TABLET | ORAL | 2 refills | Status: DC
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
## 2020-04-28 ENCOUNTER — Telehealth (HOSPITAL_COMMUNITY): Payer: Self-pay | Admitting: Psychiatry

## 2020-04-28 MED ORDER — TRAZODONE HCL 150 MG PO TABS: 150.0000 mg | ORAL_TABLET | Freq: Every day | ORAL | 2 refills | Status: DC

## 2020-04-28 NOTE — Telephone Encounter (Signed)
I am sending a new Rx for Trazodone 150 mg HS. Instruct her to try half tab at bedtime and if half is not effective then may take whole tab at bedtime for sleep.

## 2020-04-28 NOTE — Telephone Encounter (Signed)
Called and spoke with patient to relate to her the instructions on the new rx of Trazodone. She verbalized her understanding and will pick up Rx today.

## 2020-05-10 ENCOUNTER — Telehealth (HOSPITAL_COMMUNITY): Payer: BC Managed Care – PPO | Admitting: Psychiatry

## 2020-05-11 ENCOUNTER — Telehealth (INDEPENDENT_AMBULATORY_CARE_PROVIDER_SITE_OTHER): Payer: Self-pay

## 2020-05-11 NOTE — Telephone Encounter (Signed)
I agree

## 2020-05-22 ENCOUNTER — Other Ambulatory Visit: Payer: Self-pay

## 2020-05-22 ENCOUNTER — Encounter (INDEPENDENT_AMBULATORY_CARE_PROVIDER_SITE_OTHER): Payer: Self-pay | Admitting: Internal Medicine

## 2020-05-22 ENCOUNTER — Ambulatory Visit (INDEPENDENT_AMBULATORY_CARE_PROVIDER_SITE_OTHER): Payer: BC Managed Care – PPO | Admitting: Internal Medicine

## 2020-05-22 VITALS — BP 146/81 | HR 103 | Temp 97.6°F | Resp 18 | Ht 62.0 in | Wt 267.0 lb

## 2020-05-22 DIAGNOSIS — F520 Hypoactive sexual desire disorder: Secondary | ICD-10-CM

## 2020-05-22 DIAGNOSIS — E559 Vitamin D deficiency, unspecified: Secondary | ICD-10-CM

## 2020-05-22 DIAGNOSIS — E890 Postprocedural hypothyroidism: Secondary | ICD-10-CM

## 2020-05-22 DIAGNOSIS — I10 Essential (primary) hypertension: Secondary | ICD-10-CM | POA: Diagnosis not present

## 2020-05-22 MED ORDER — NP THYROID 30 MG PO TABS: 30.0000 mg | ORAL_TABLET | Freq: Two times a day (BID) | ORAL | 3 refills | Status: DC
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
## 2020-05-29 ENCOUNTER — Other Ambulatory Visit (INDEPENDENT_AMBULATORY_CARE_PROVIDER_SITE_OTHER): Payer: Self-pay

## 2020-05-30 MED ORDER — PROGESTERONE 200 MG PO CAPS
600.0000 mg | ORAL_CAPSULE | Freq: Every day | ORAL | 0 refills | Status: DC
Start: 2020-05-30 — End: 2020-07-06

## 2020-06-07 ENCOUNTER — Ambulatory Visit (INDEPENDENT_AMBULATORY_CARE_PROVIDER_SITE_OTHER): Payer: BC Managed Care – PPO | Admitting: Internal Medicine

## 2020-06-20 ENCOUNTER — Telehealth (INDEPENDENT_AMBULATORY_CARE_PROVIDER_SITE_OTHER): Payer: Self-pay

## 2020-06-20 NOTE — Telephone Encounter (Signed)
Patient called and left a detailed voice message that she was negative for Covid from her work and she is positive that she has bronchitis as she stated that she has had so many times and she knows that it is bronchitis. I advised for patient to go to Urgent Care without delay for treatment. Patient verbalized an understanding.

## 2020-06-22 DIAGNOSIS — R059 Cough, unspecified: Secondary | ICD-10-CM | POA: Diagnosis not present

## 2020-06-22 DIAGNOSIS — Z20822 Contact with and (suspected) exposure to covid-19: Secondary | ICD-10-CM | POA: Diagnosis not present

## 2020-06-22 DIAGNOSIS — J209 Acute bronchitis, unspecified: Secondary | ICD-10-CM | POA: Diagnosis not present

## 2020-07-05 ENCOUNTER — Ambulatory Visit (INDEPENDENT_AMBULATORY_CARE_PROVIDER_SITE_OTHER): Payer: BC Managed Care – PPO | Admitting: Internal Medicine

## 2020-07-05 ENCOUNTER — Ambulatory Visit (HOSPITAL_COMMUNITY)
Admission: RE | Admit: 2020-07-05 | Discharge: 2020-07-05 | Disposition: A | Discharge status: Home / Self Care | Payer: BC Managed Care – PPO | Source: Ambulatory Visit | Attending: Internal Medicine | Admitting: Internal Medicine

## 2020-07-05 ENCOUNTER — Encounter (INDEPENDENT_AMBULATORY_CARE_PROVIDER_SITE_OTHER): Payer: Self-pay | Admitting: Internal Medicine

## 2020-07-05 ENCOUNTER — Other Ambulatory Visit: Payer: Self-pay

## 2020-07-05 VITALS — BP 122/76 | HR 103 | Temp 97.2°F | Ht 62.0 in | Wt 270.2 lb

## 2020-07-05 DIAGNOSIS — J40 Bronchitis, not specified as acute or chronic: Secondary | ICD-10-CM

## 2020-07-05 DIAGNOSIS — R5381 Other malaise: Secondary | ICD-10-CM

## 2020-07-05 DIAGNOSIS — R062 Wheezing: Secondary | ICD-10-CM | POA: Diagnosis present

## 2020-07-05 DIAGNOSIS — R5383 Other fatigue: Secondary | ICD-10-CM
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
## 2020-07-06 ENCOUNTER — Other Ambulatory Visit (INDEPENDENT_AMBULATORY_CARE_PROVIDER_SITE_OTHER): Payer: Self-pay | Admitting: Internal Medicine

## 2020-07-06 ENCOUNTER — Telehealth (INDEPENDENT_AMBULATORY_CARE_PROVIDER_SITE_OTHER): Payer: Self-pay | Admitting: Internal Medicine

## 2020-07-06 MED ORDER — PREDNISONE 20 MG PO TABS: 40.0000 mg | ORAL_TABLET | Freq: Every day | ORAL | 1 refills | Status: DC

## 2020-07-06 NOTE — Telephone Encounter (Signed)
Tell her that the chest x-ray is normal and I have sent a MyChart message to her.  Ask her to let us know what her COVID test is from yesterday.  Thanks.

## 2020-07-18 ENCOUNTER — Other Ambulatory Visit: Payer: Self-pay

## 2020-07-18 ENCOUNTER — Encounter (HOSPITAL_COMMUNITY): Payer: Self-pay | Admitting: Psychiatry

## 2020-07-18 ENCOUNTER — Telehealth (INDEPENDENT_AMBULATORY_CARE_PROVIDER_SITE_OTHER): Payer: BC Managed Care – PPO | Admitting: Psychiatry

## 2020-07-18 DIAGNOSIS — F3342 Major depressive disorder, recurrent, in full remission: Secondary | ICD-10-CM | POA: Diagnosis not present

## 2020-07-18 DIAGNOSIS — F900 Attention-deficit hyperactivity disorder, predominantly inattentive type: Secondary | ICD-10-CM

## 2020-07-18 DIAGNOSIS — F419 Anxiety disorder, unspecified: Secondary | ICD-10-CM

## 2020-07-18 MED ORDER — ESCITALOPRAM OXALATE 20 MG PO TABS: 20.0000 mg | ORAL_TABLET | Freq: Every day | ORAL | 2 refills | Status: DC

## 2020-07-18 MED ORDER — BUPROPION HCL ER (XL) 300 MG PO TB24: 300.0000 mg | ORAL_TABLET | ORAL | 2 refills | Status: DC

## 2020-07-18 MED ORDER — TRAZODONE HCL 150 MG PO TABS: 150.0000 mg | ORAL_TABLET | Freq: Every day | ORAL | 2 refills | Status: DC

## 2020-07-18 MED ORDER — ATOMOXETINE HCL 25 MG PO CAPS: 25.0000 mg | ORAL_CAPSULE | Freq: Every day | ORAL | 2 refills | Status: DC
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
## 2020-07-24 ENCOUNTER — Ambulatory Visit (INDEPENDENT_AMBULATORY_CARE_PROVIDER_SITE_OTHER): Payer: BC Managed Care – PPO | Admitting: Internal Medicine

## 2020-08-11 ENCOUNTER — Other Ambulatory Visit (INDEPENDENT_AMBULATORY_CARE_PROVIDER_SITE_OTHER): Payer: Self-pay | Admitting: Internal Medicine

## 2020-08-11 ENCOUNTER — Other Ambulatory Visit (HOSPITAL_COMMUNITY): Payer: Self-pay | Admitting: Psychiatry

## 2020-08-11 DIAGNOSIS — F3342 Major depressive disorder, recurrent, in full remission: Secondary | ICD-10-CM

## 2020-08-24 ENCOUNTER — Other Ambulatory Visit (INDEPENDENT_AMBULATORY_CARE_PROVIDER_SITE_OTHER): Payer: Self-pay | Admitting: Internal Medicine

## 2020-09-13 ENCOUNTER — Ambulatory Visit (INDEPENDENT_AMBULATORY_CARE_PROVIDER_SITE_OTHER): Payer: BC Managed Care – PPO | Admitting: Nurse Practitioner

## 2020-09-13 ENCOUNTER — Other Ambulatory Visit: Payer: Self-pay

## 2020-09-13 VITALS — BP 142/88 | HR 108 | Temp 96.6°F | Ht 62.0 in | Wt 276.0 lb

## 2020-09-13 DIAGNOSIS — M170 Bilateral primary osteoarthritis of knee: Secondary | ICD-10-CM

## 2020-09-13 DIAGNOSIS — R232 Flushing: Secondary | ICD-10-CM | POA: Diagnosis not present

## 2020-09-13 DIAGNOSIS — J4520 Mild intermittent asthma, uncomplicated: Secondary | ICD-10-CM | POA: Diagnosis not present

## 2020-09-13 DIAGNOSIS — I10 Essential (primary) hypertension: Secondary | ICD-10-CM

## 2020-09-13 MED ORDER — AMLODIPINE BESYLATE 10 MG PO TABS: 10.0000 mg | ORAL_TABLET | Freq: Every day | ORAL | 1 refills | Status: DC

## 2020-09-13 MED ORDER — ALBUTEROL SULFATE HFA 108 (90 BASE) MCG/ACT IN AERS: 1.0000 | INHALATION_SPRAY | Freq: Four times a day (QID) | RESPIRATORY_TRACT | 3 refills | Status: DC | PRN

## 2020-09-13 MED ORDER — GABAPENTIN 100 MG PO CAPS: 100.0000 mg | ORAL_CAPSULE | Freq: Three times a day (TID) | ORAL | 2 refills | Status: DC
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
## 2020-09-22 ENCOUNTER — Other Ambulatory Visit (INDEPENDENT_AMBULATORY_CARE_PROVIDER_SITE_OTHER): Payer: Self-pay | Admitting: Internal Medicine

## 2020-09-27 ENCOUNTER — Encounter (INDEPENDENT_AMBULATORY_CARE_PROVIDER_SITE_OTHER): Payer: Self-pay | Admitting: Nurse Practitioner

## 2020-09-27 ENCOUNTER — Ambulatory Visit (INDEPENDENT_AMBULATORY_CARE_PROVIDER_SITE_OTHER): Payer: BC Managed Care – PPO | Admitting: Nurse Practitioner

## 2020-09-27 ENCOUNTER — Other Ambulatory Visit: Payer: Self-pay

## 2020-09-27 VITALS — BP 136/77 | HR 118 | Temp 97.6°F | Resp 18 | Ht 62.0 in | Wt 271.0 lb

## 2020-09-27 DIAGNOSIS — M170 Bilateral primary osteoarthritis of knee: Secondary | ICD-10-CM

## 2020-09-27 DIAGNOSIS — R232 Flushing: Secondary | ICD-10-CM

## 2020-09-27 DIAGNOSIS — I10 Essential (primary) hypertension: Secondary | ICD-10-CM

## 2020-09-27 MED ORDER — GABAPENTIN 400 MG PO CAPS: 400.0000 mg | ORAL_CAPSULE | Freq: Three times a day (TID) | ORAL | 2 refills | Status: DC
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
## 2020-10-11 ENCOUNTER — Other Ambulatory Visit: Payer: Self-pay

## 2020-10-11 ENCOUNTER — Telehealth (HOSPITAL_COMMUNITY): Payer: BC Managed Care – PPO | Admitting: Psychiatry

## 2020-10-26 ENCOUNTER — Other Ambulatory Visit (INDEPENDENT_AMBULATORY_CARE_PROVIDER_SITE_OTHER): Payer: Self-pay | Admitting: Internal Medicine

## 2020-11-23 ENCOUNTER — Other Ambulatory Visit (HOSPITAL_COMMUNITY): Payer: Self-pay | Admitting: Psychiatry

## 2020-11-23 DIAGNOSIS — F3342 Major depressive disorder, recurrent, in full remission: Secondary | ICD-10-CM

## 2020-12-12 ENCOUNTER — Other Ambulatory Visit (HOSPITAL_COMMUNITY): Payer: Self-pay | Admitting: Psychiatry

## 2020-12-12 DIAGNOSIS — F900 Attention-deficit hyperactivity disorder, predominantly inattentive type: Secondary | ICD-10-CM

## 2020-12-12 DIAGNOSIS — F419 Anxiety disorder, unspecified: Secondary | ICD-10-CM

## 2020-12-12 DIAGNOSIS — F3342 Major depressive disorder, recurrent, in full remission: Secondary | ICD-10-CM

## 2020-12-28 ENCOUNTER — Ambulatory Visit (INDEPENDENT_AMBULATORY_CARE_PROVIDER_SITE_OTHER): Payer: BC Managed Care – PPO | Admitting: Nurse Practitioner

## 2021-01-10 ENCOUNTER — Other Ambulatory Visit (INDEPENDENT_AMBULATORY_CARE_PROVIDER_SITE_OTHER): Payer: Self-pay | Admitting: Internal Medicine

## 2021-01-17 ENCOUNTER — Ambulatory Visit (INDEPENDENT_AMBULATORY_CARE_PROVIDER_SITE_OTHER): Payer: BC Managed Care – PPO | Admitting: Nurse Practitioner

## 2021-01-17 ENCOUNTER — Other Ambulatory Visit (INDEPENDENT_AMBULATORY_CARE_PROVIDER_SITE_OTHER): Payer: Self-pay | Admitting: Nurse Practitioner

## 2021-01-17 ENCOUNTER — Telehealth (INDEPENDENT_AMBULATORY_CARE_PROVIDER_SITE_OTHER): Payer: Self-pay | Admitting: Nurse Practitioner

## 2021-01-17 ENCOUNTER — Other Ambulatory Visit: Payer: Self-pay

## 2021-01-17 DIAGNOSIS — U071 COVID-19: Secondary | ICD-10-CM

## 2021-01-17 MED ORDER — MOLNUPIRAVIR EUA 200MG CAPSULE: 4.0000 | ORAL_CAPSULE | Freq: Two times a day (BID) | ORAL | 0 refills | Status: AC

## 2021-01-17 NOTE — Telephone Encounter (Signed)
We have Please call patient get her scheduled for routine follow-up in the next 3 to 6 months.  I did a telephone visit with her today and feel to notice that she does not have a follow-up scheduled prior to finishing the visit. Thank you.
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
## 2021-03-15 ENCOUNTER — Other Ambulatory Visit (HOSPITAL_COMMUNITY): Payer: Self-pay | Admitting: Psychiatry

## 2021-03-15 DIAGNOSIS — F900 Attention-deficit hyperactivity disorder, predominantly inattentive type: Secondary | ICD-10-CM

## 2021-03-15 DIAGNOSIS — F3342 Major depressive disorder, recurrent, in full remission: Secondary | ICD-10-CM

## 2021-03-15 DIAGNOSIS — F419 Anxiety disorder, unspecified: Secondary | ICD-10-CM

## 2021-03-26 DIAGNOSIS — M797 Fibromyalgia: Secondary | ICD-10-CM | POA: Diagnosis not present

## 2021-03-26 DIAGNOSIS — E039 Hypothyroidism, unspecified: Secondary | ICD-10-CM | POA: Diagnosis not present

## 2021-03-26 DIAGNOSIS — I10 Essential (primary) hypertension: Secondary | ICD-10-CM | POA: Diagnosis not present

## 2021-03-26 DIAGNOSIS — J302 Other seasonal allergic rhinitis: Secondary | ICD-10-CM | POA: Diagnosis not present

## 2021-04-02 DIAGNOSIS — Z13220 Encounter for screening for lipoid disorders: Secondary | ICD-10-CM | POA: Diagnosis not present

## 2021-04-02 DIAGNOSIS — M797 Fibromyalgia: Secondary | ICD-10-CM | POA: Diagnosis not present

## 2021-04-02 DIAGNOSIS — I10 Essential (primary) hypertension: Secondary | ICD-10-CM | POA: Diagnosis not present

## 2021-04-02 DIAGNOSIS — E559 Vitamin D deficiency, unspecified: Secondary | ICD-10-CM | POA: Diagnosis not present

## 2021-04-02 DIAGNOSIS — Z131 Encounter for screening for diabetes mellitus: Secondary | ICD-10-CM | POA: Diagnosis not present

## 2021-04-02 DIAGNOSIS — E039 Hypothyroidism, unspecified: Secondary | ICD-10-CM | POA: Diagnosis not present

## 2021-04-02 IMAGING — DX DG CHEST 2V
2 series · 2 of 2 positions shown · non-contrast
Comparison: 06/09/2014

CLINICAL DATA: Wheezing and bronchitis

EXAM:
CHEST - 2 VIEW

[chest pa]
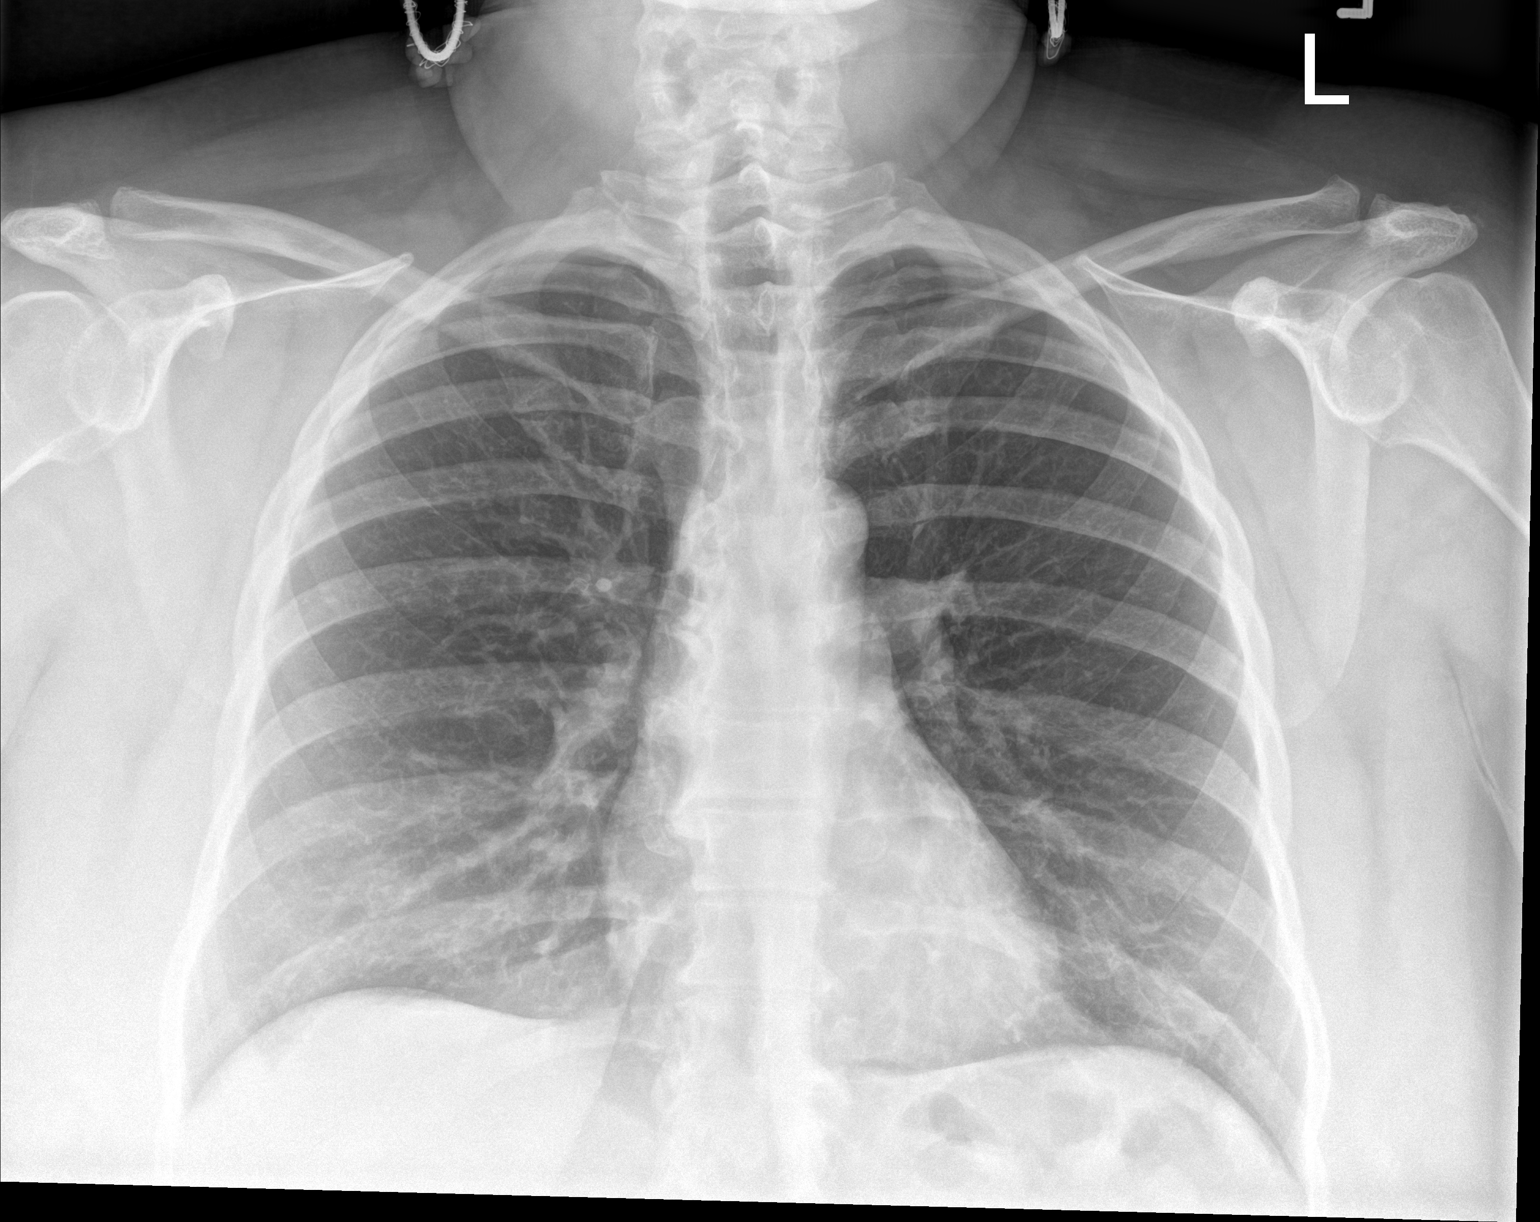

[chest lat]
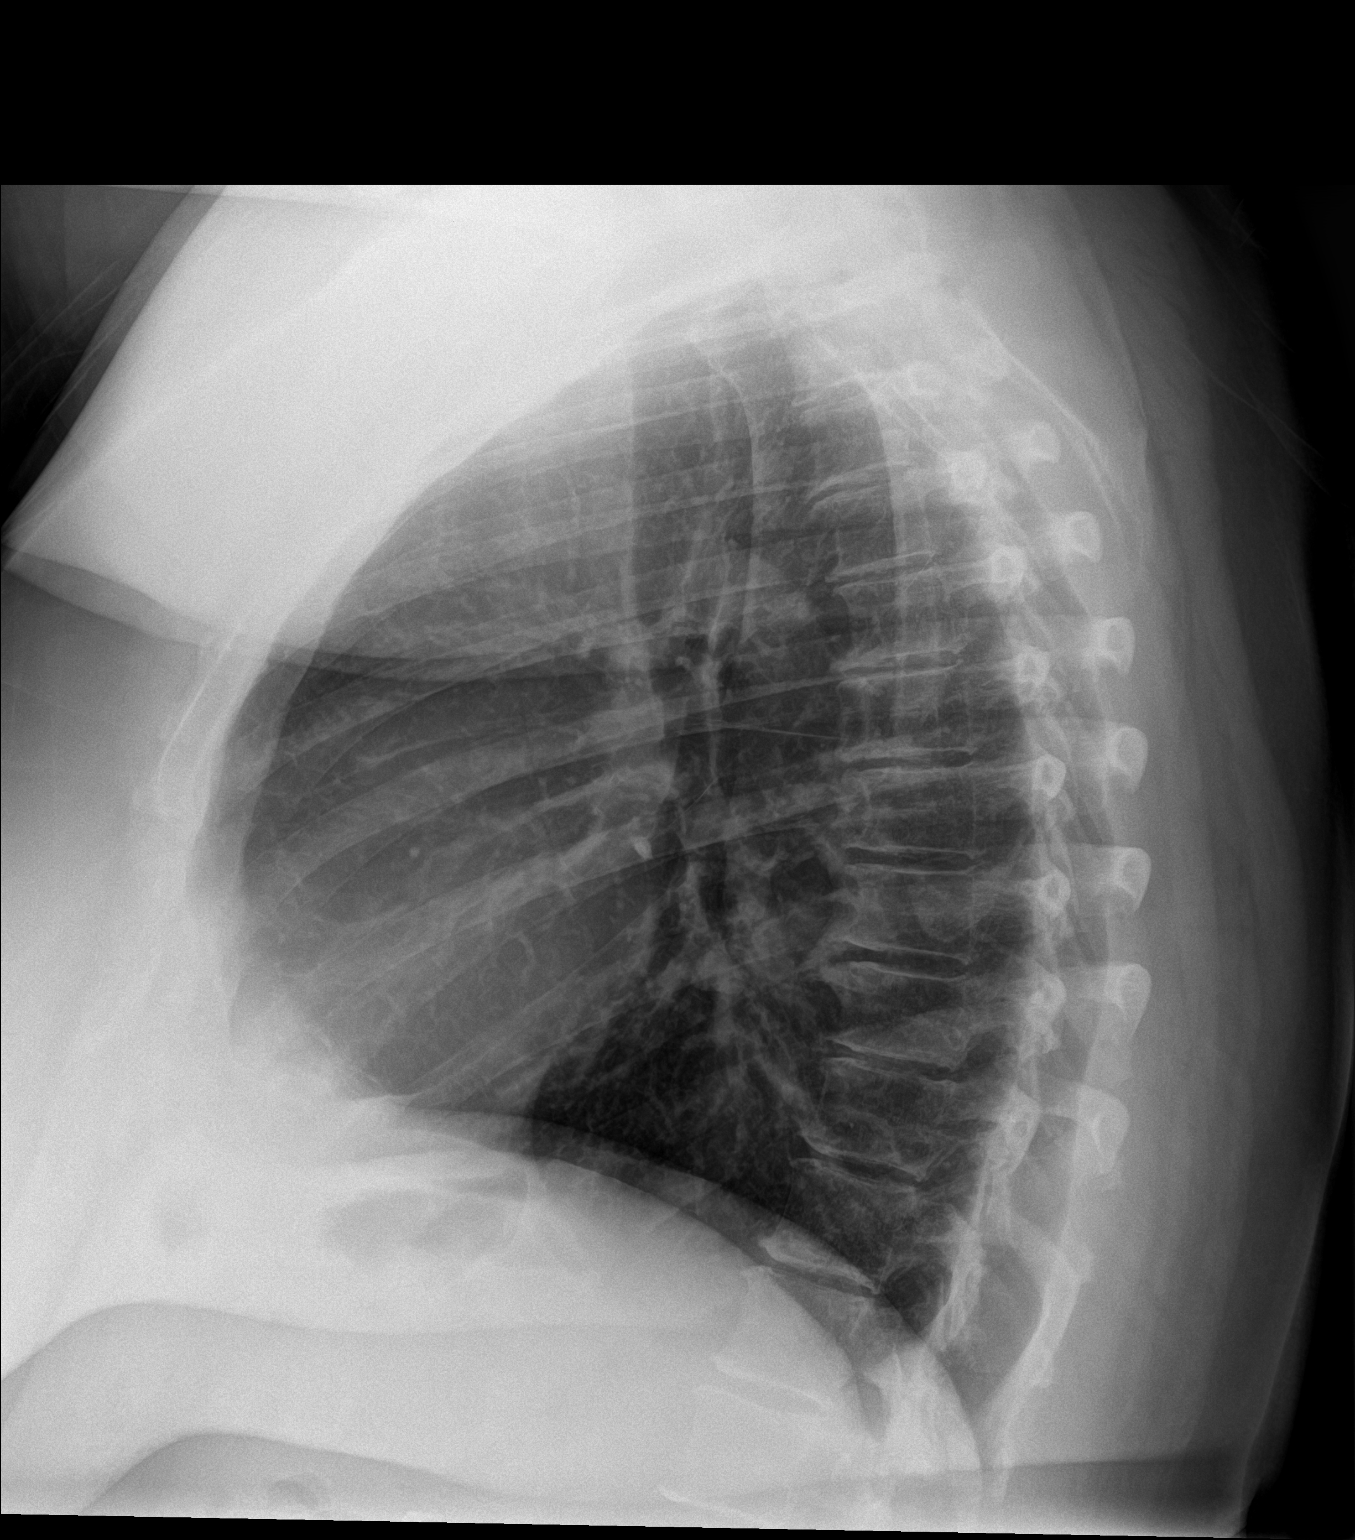

[2 of 2 positions shown; findings below may reference images not displayed]

FINDINGS: The heart size and mediastinal contours are within normal limits.
Both lungs are clear. The visualized skeletal structures are
unremarkable.
IMPRESSION: No active cardiopulmonary disease.

## 2021-04-09 DIAGNOSIS — J302 Other seasonal allergic rhinitis: Secondary | ICD-10-CM | POA: Diagnosis not present

## 2021-04-09 DIAGNOSIS — E039 Hypothyroidism, unspecified: Secondary | ICD-10-CM | POA: Diagnosis not present

## 2021-04-09 DIAGNOSIS — I10 Essential (primary) hypertension: Secondary | ICD-10-CM | POA: Diagnosis not present

## 2021-04-09 DIAGNOSIS — M797 Fibromyalgia: Secondary | ICD-10-CM | POA: Diagnosis not present

## 2022-03-19 ENCOUNTER — Ambulatory Visit (INDEPENDENT_AMBULATORY_CARE_PROVIDER_SITE_OTHER): Payer: Self-pay | Admitting: Psychiatry

## 2022-03-19 ENCOUNTER — Encounter (HOSPITAL_COMMUNITY): Payer: Self-pay | Admitting: Psychiatry

## 2022-03-19 DIAGNOSIS — G8929 Other chronic pain: Secondary | ICD-10-CM

## 2022-03-19 DIAGNOSIS — F4310 Post-traumatic stress disorder, unspecified: Secondary | ICD-10-CM

## 2022-03-19 DIAGNOSIS — F331 Major depressive disorder, recurrent, moderate: Secondary | ICD-10-CM | POA: Insufficient documentation

## 2022-03-19 DIAGNOSIS — F332 Major depressive disorder, recurrent severe without psychotic features: Secondary | ICD-10-CM

## 2022-03-19 DIAGNOSIS — G4709 Other insomnia: Secondary | ICD-10-CM

## 2022-03-19 DIAGNOSIS — F411 Generalized anxiety disorder: Secondary | ICD-10-CM

## 2022-03-19 DIAGNOSIS — F3341 Major depressive disorder, recurrent, in partial remission: Secondary | ICD-10-CM | POA: Insufficient documentation

## 2022-03-19 MED ORDER — DULOXETINE HCL 30 MG PO CPEP: 90.0000 mg | ORAL_CAPSULE | Freq: Every day | ORAL | 0 refills | Status: DC

## 2022-03-19 MED ORDER — TRAZODONE HCL 100 MG PO TABS: 100.0000 mg | ORAL_TABLET | Freq: Every day | ORAL | 0 refills | Status: DC

## 2022-03-19 NOTE — Patient Instructions (Signed)
We restarted your trazodone at 100mg  nightly. You can also decrease your melatonin to 5mg  nightly around dinner time. Try cutting back on the amount of caffeine you take in daily. Each week try to cut back by half a cup of coffee until you are down to one cup daily. Go to google and find CBT-I (insomnia) worksheets which can help with the thoughts associated with insomnia. We also increased your duloxetine (cymbalta) to 90mg  daily; be sure to switch this to a morning dose. Be on the lookout for serotonin syndrome (reference sheet attached) as we discussed in our visit today. If you can, try calling the numbers provided to get neuropsychiatric testing for ADHD. [place] (part of [facility]) [phone] has multiple providers. [place] [phone] has several providers specializing in ADHD/Bipolar= Jillene Bucks, PhD is expert at Adult ADHD, Darryl Nestle, PhD treats adults with both diagnoses.
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
## 2022-04-17 ENCOUNTER — Other Ambulatory Visit (HOSPITAL_COMMUNITY): Payer: Self-pay | Admitting: Psychiatry

## 2022-04-17 DIAGNOSIS — G4709 Other insomnia: Secondary | ICD-10-CM

## 2022-04-18 ENCOUNTER — Encounter (HOSPITAL_COMMUNITY): Payer: Self-pay | Admitting: Psychiatry

## 2022-04-18 ENCOUNTER — Telehealth (INDEPENDENT_AMBULATORY_CARE_PROVIDER_SITE_OTHER): Payer: Self-pay | Admitting: Psychiatry

## 2022-04-18 DIAGNOSIS — F411 Generalized anxiety disorder: Secondary | ICD-10-CM

## 2022-04-18 DIAGNOSIS — G4709 Other insomnia: Secondary | ICD-10-CM

## 2022-04-18 DIAGNOSIS — G8929 Other chronic pain: Secondary | ICD-10-CM

## 2022-04-18 DIAGNOSIS — F5081 Binge eating disorder: Secondary | ICD-10-CM

## 2022-04-18 DIAGNOSIS — F4310 Post-traumatic stress disorder, unspecified: Secondary | ICD-10-CM

## 2022-04-18 DIAGNOSIS — F332 Major depressive disorder, recurrent severe without psychotic features: Secondary | ICD-10-CM

## 2022-04-18 DIAGNOSIS — F50814 Binge eating disorder, in remission: Secondary | ICD-10-CM | POA: Insufficient documentation

## 2022-04-18 DIAGNOSIS — F50819 Binge eating disorder, unspecified: Secondary | ICD-10-CM

## 2022-04-18 MED ORDER — LISDEXAMFETAMINE DIMESYLATE 20 MG PO CAPS: 20.0000 mg | ORAL_CAPSULE | Freq: Every day | ORAL | 0 refills | Status: DC

## 2022-04-18 MED ORDER — TRAZODONE HCL 100 MG PO TABS: 100.0000 mg | ORAL_TABLET | Freq: Every day | ORAL | 1 refills | Status: DC

## 2022-04-18 MED ORDER — DULOXETINE HCL 30 MG PO CPEP: 90.0000 mg | ORAL_CAPSULE | Freq: Every day | ORAL | 0 refills | Status: DC
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
## 2022-04-18 NOTE — Patient Instructions (Signed)
We added vyvanse to your regimen today. Take 20mg  first thing in the morning, this should help with the binge eating as well as some of your concentration difficulty.

## 2022-05-14 ENCOUNTER — Encounter (HOSPITAL_COMMUNITY): Payer: Self-pay | Admitting: Psychiatry

## 2022-05-14 ENCOUNTER — Telehealth (INDEPENDENT_AMBULATORY_CARE_PROVIDER_SITE_OTHER): Payer: BC Managed Care – PPO | Admitting: Psychiatry

## 2022-05-14 DIAGNOSIS — F4310 Post-traumatic stress disorder, unspecified: Secondary | ICD-10-CM | POA: Diagnosis not present

## 2022-05-14 DIAGNOSIS — G4709 Other insomnia: Secondary | ICD-10-CM

## 2022-05-14 DIAGNOSIS — F5081 Binge eating disorder: Secondary | ICD-10-CM | POA: Diagnosis not present

## 2022-05-14 DIAGNOSIS — F411 Generalized anxiety disorder: Secondary | ICD-10-CM | POA: Diagnosis not present

## 2022-05-14 DIAGNOSIS — F332 Major depressive disorder, recurrent severe without psychotic features: Secondary | ICD-10-CM

## 2022-05-14 DIAGNOSIS — G8929 Other chronic pain: Secondary | ICD-10-CM

## 2022-05-14 DIAGNOSIS — F50819 Binge eating disorder, unspecified: Secondary | ICD-10-CM

## 2022-05-14 MED ORDER — DULOXETINE HCL 60 MG PO CPEP: 120.0000 mg | ORAL_CAPSULE | Freq: Every day | ORAL | 2 refills | Status: DC

## 2022-05-14 MED ORDER — TRAZODONE HCL 100 MG PO TABS: 100.0000 mg | ORAL_TABLET | Freq: Every day | ORAL | 1 refills | Status: DC

## 2022-05-14 MED ORDER — LISDEXAMFETAMINE DIMESYLATE 20 MG PO CAPS: 20.0000 mg | ORAL_CAPSULE | Freq: Every day | ORAL | 0 refills | Status: DC

## 2022-05-14 NOTE — Patient Instructions (Signed)
We increased your cymbalta (duloxetine) to 120mg  (two 60mg  capsules) once daily. If you can, please try to have your CPAP assessed to have better airway protection while you sleep. This will pay dividends for your ability to focus and need less caffeine during the day if you are resting more effectively.
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
## 2022-05-28 ENCOUNTER — Telehealth (HOSPITAL_COMMUNITY): Payer: Self-pay

## 2022-05-28 NOTE — Telephone Encounter (Signed)
Pt called in stating that she is needing a new rx sent to [facility] in [place] since her last rx didn't have refills she is needing to have it sent in again for prior authorization. Please advise

## 2022-05-28 NOTE — Telephone Encounter (Signed)
Pt called back advised of below pt wasn't aware that there was a generic

## 2022-05-28 NOTE — Telephone Encounter (Signed)
Contacted pharmacy they are saying rx went through without needing prior auth not sure what pt is talking about... called pt no answer left vm

## 2022-06-06 ENCOUNTER — Encounter (HOSPITAL_COMMUNITY): Payer: Self-pay | Admitting: Psychiatry

## 2022-06-06 ENCOUNTER — Telehealth (INDEPENDENT_AMBULATORY_CARE_PROVIDER_SITE_OTHER): Payer: BC Managed Care – PPO | Admitting: Psychiatry

## 2022-06-06 DIAGNOSIS — F331 Major depressive disorder, recurrent, moderate: Secondary | ICD-10-CM | POA: Diagnosis not present

## 2022-06-06 DIAGNOSIS — F5081 Binge eating disorder: Secondary | ICD-10-CM

## 2022-06-06 DIAGNOSIS — G8929 Other chronic pain: Secondary | ICD-10-CM

## 2022-06-06 DIAGNOSIS — F411 Generalized anxiety disorder: Secondary | ICD-10-CM

## 2022-06-06 DIAGNOSIS — F50819 Binge eating disorder, unspecified: Secondary | ICD-10-CM

## 2022-06-06 DIAGNOSIS — G4709 Other insomnia: Secondary | ICD-10-CM

## 2022-06-06 DIAGNOSIS — F4310 Post-traumatic stress disorder, unspecified: Secondary | ICD-10-CM

## 2022-06-06 MED ORDER — LISDEXAMFETAMINE DIMESYLATE 20 MG PO CAPS: 20.0000 mg | ORAL_CAPSULE | Freq: Every day | ORAL | 0 refills | Status: DC

## 2022-06-06 MED ORDER — TRAZODONE HCL 100 MG PO TABS: 100.0000 mg | ORAL_TABLET | Freq: Every day | ORAL | 1 refills | Status: DC

## 2022-06-06 NOTE — Patient Instructions (Signed)
We did not make any medication changes today.  Hopefully some of the stress will increase as you settle into your new job and this will likely result in fewer binge episodes.  Continue to try and cut back the amount of caffeine you intake and that should continue to help with sleep.  As you can, please work on the CPAP issue.
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
## 2022-08-08 ENCOUNTER — Telehealth (HOSPITAL_COMMUNITY): Payer: BC Managed Care – PPO | Admitting: Psychiatry

## 2022-08-22 ENCOUNTER — Encounter (HOSPITAL_COMMUNITY): Payer: Self-pay | Admitting: Psychiatry

## 2022-08-22 ENCOUNTER — Telehealth (HOSPITAL_COMMUNITY): Payer: BC Managed Care – PPO | Admitting: Psychiatry

## 2022-08-22 DIAGNOSIS — F50819 Binge eating disorder, unspecified: Secondary | ICD-10-CM

## 2022-08-22 DIAGNOSIS — G4709 Other insomnia: Secondary | ICD-10-CM

## 2022-08-22 DIAGNOSIS — F411 Generalized anxiety disorder: Secondary | ICD-10-CM | POA: Diagnosis not present

## 2022-08-22 DIAGNOSIS — Z79899 Other long term (current) drug therapy: Secondary | ICD-10-CM | POA: Insufficient documentation

## 2022-08-22 DIAGNOSIS — F4310 Post-traumatic stress disorder, unspecified: Secondary | ICD-10-CM

## 2022-08-22 DIAGNOSIS — F332 Major depressive disorder, recurrent severe without psychotic features: Secondary | ICD-10-CM

## 2022-08-22 DIAGNOSIS — G8929 Other chronic pain: Secondary | ICD-10-CM

## 2022-08-22 DIAGNOSIS — F5081 Binge eating disorder: Secondary | ICD-10-CM

## 2022-08-22 MED ORDER — DULOXETINE HCL 60 MG PO CPEP: 120.0000 mg | ORAL_CAPSULE | Freq: Every day | ORAL | 2 refills | Status: DC

## 2022-08-22 MED ORDER — LISDEXAMFETAMINE DIMESYLATE 20 MG PO CAPS: 20.0000 mg | ORAL_CAPSULE | Freq: Every day | ORAL | 0 refills | Status: DC

## 2022-08-22 MED ORDER — TRAZODONE HCL 100 MG PO TABS: 200.0000 mg | ORAL_TABLET | Freq: Every day | ORAL | 1 refills | Status: DC

## 2022-08-22 NOTE — Patient Instructions (Signed)
We changed your medication today.  Start taking 2 capsules of the 100 mg trazodone nightly.  Also switch the Cymbalta back to the morning and this should help with her anxiety and binge eating during the day.  We kept the Vyvanse the same but please swing by the clinic to have a urine tox screen done.  Call your insurer to find a psychotherapist that is in network and it can be helpful to find one with experience in eating disorders, likely her binge eating.
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
## 2022-09-06 ENCOUNTER — Ambulatory Visit (INDEPENDENT_AMBULATORY_CARE_PROVIDER_SITE_OTHER): Payer: BC Managed Care – PPO

## 2022-09-06 ENCOUNTER — Ambulatory Visit
Admission: EM | Admit: 2022-09-06 | Discharge: 2022-09-06 | Disposition: A | Discharge status: Home / Self Care | Payer: BC Managed Care – PPO | Attending: Family Medicine | Admitting: Family Medicine

## 2022-09-06 DIAGNOSIS — M25572 Pain in left ankle and joints of left foot: Secondary | ICD-10-CM

## 2022-09-06 DIAGNOSIS — M79672 Pain in left foot: Secondary | ICD-10-CM

## 2022-09-06 MED ORDER — PREDNISONE 20 MG PO TABS: 40.0000 mg | ORAL_TABLET | Freq: Every day | ORAL | 0 refills | Status: DC

## 2022-09-06 MED ORDER — IBUPROFEN 800 MG PO TABS
800.0000 mg | ORAL_TABLET | Freq: Once | ORAL | Status: AC
  Administered 2022-09-06: 800 mg via ORAL

## 2022-09-06 NOTE — ED Triage Notes (Signed)
Pt reports pain in the left foot since this morning when she was walking and felt something pulled and burning sensation in left foot.
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
## 2022-09-24 ENCOUNTER — Encounter (HOSPITAL_COMMUNITY): Payer: Self-pay | Admitting: Psychiatry

## 2022-09-24 ENCOUNTER — Telehealth (INDEPENDENT_AMBULATORY_CARE_PROVIDER_SITE_OTHER): Payer: BC Managed Care – PPO | Admitting: Psychiatry

## 2022-09-24 DIAGNOSIS — F331 Major depressive disorder, recurrent, moderate: Secondary | ICD-10-CM | POA: Diagnosis not present

## 2022-09-24 DIAGNOSIS — F4310 Post-traumatic stress disorder, unspecified: Secondary | ICD-10-CM

## 2022-09-24 DIAGNOSIS — Z79899 Other long term (current) drug therapy: Secondary | ICD-10-CM

## 2022-09-24 DIAGNOSIS — F5081 Binge eating disorder: Secondary | ICD-10-CM

## 2022-09-24 DIAGNOSIS — F411 Generalized anxiety disorder: Secondary | ICD-10-CM

## 2022-09-24 DIAGNOSIS — G4709 Other insomnia: Secondary | ICD-10-CM

## 2022-09-24 DIAGNOSIS — F50819 Binge eating disorder, unspecified: Secondary | ICD-10-CM

## 2022-09-24 MED ORDER — TRAZODONE HCL 100 MG PO TABS: 200.0000 mg | ORAL_TABLET | Freq: Every day | ORAL | 1 refills | Status: DC

## 2022-09-24 NOTE — Patient Instructions (Signed)
Once he gets her urine drug screen resulted we will plan on increasing Vyvanse to 30 mg once daily to address ongoing binge episodes.  Go to [email or web address] to find a psychotherapist with eating disorder training that is in network.  Please also reach out to your PCP for starting nutrition/dietitian services.
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
## 2022-10-23 ENCOUNTER — Telehealth (HOSPITAL_COMMUNITY): Payer: BC Managed Care – PPO | Admitting: Psychiatry

## 2022-10-24 ENCOUNTER — Telehealth (HOSPITAL_COMMUNITY): Payer: BC Managed Care – PPO | Admitting: Psychiatry

## 2022-10-28 LAB — DRUG TEST, GENERAL TOXICOLOGY, URINE
Acetone: NOT DETECTED
Ethanol: 12 mg/dL — ABNORMAL HIGH
Isopropanol: NOT DETECTED
Methanol: NOT DETECTED

## 2022-10-31 ENCOUNTER — Other Ambulatory Visit (HOSPITAL_COMMUNITY): Payer: Self-pay | Admitting: Psychiatry

## 2022-10-31 DIAGNOSIS — F5081 Binge eating disorder: Secondary | ICD-10-CM

## 2022-10-31 DIAGNOSIS — F50819 Binge eating disorder, unspecified: Secondary | ICD-10-CM

## 2022-10-31 MED ORDER — LISDEXAMFETAMINE DIMESYLATE 20 MG PO CAPS: 20.0000 mg | ORAL_CAPSULE | Freq: Every day | ORAL | 0 refills | Status: DC

## 2022-10-31 NOTE — Telephone Encounter (Addendum)
From: Merwyn Katos To: Office of Elsie Lincoln, MD Sent: 10/30/2022  9:55 AM EDT Subject: Medication Renewal Request  Refills have been requested for the following medications:      lisdexamfetamine (VYVANSE) 20 MG capsule [Mahonri Seiden A Markita Stcharles]     Patient Comment: Was this still going to be increased   Preferred pharmacy: [id number] - [place],  - [address] Delivery method: Pickup  Patient was able to get routine urine drug testing which had slight positivity for alcohol but no illicit substances.

## 2022-11-06 ENCOUNTER — Encounter (HOSPITAL_COMMUNITY): Payer: Self-pay

## 2022-12-19 ENCOUNTER — Encounter (HOSPITAL_COMMUNITY): Payer: Self-pay | Admitting: Psychiatry

## 2022-12-19 ENCOUNTER — Telehealth (INDEPENDENT_AMBULATORY_CARE_PROVIDER_SITE_OTHER): Payer: BC Managed Care – PPO | Admitting: Psychiatry

## 2022-12-19 DIAGNOSIS — F50819 Binge eating disorder, unspecified: Secondary | ICD-10-CM

## 2022-12-19 DIAGNOSIS — F411 Generalized anxiety disorder: Secondary | ICD-10-CM

## 2022-12-19 DIAGNOSIS — F331 Major depressive disorder, recurrent, moderate: Secondary | ICD-10-CM

## 2022-12-19 DIAGNOSIS — F4310 Post-traumatic stress disorder, unspecified: Secondary | ICD-10-CM

## 2022-12-19 DIAGNOSIS — Z79899 Other long term (current) drug therapy: Secondary | ICD-10-CM

## 2022-12-19 DIAGNOSIS — G8929 Other chronic pain: Secondary | ICD-10-CM

## 2022-12-19 DIAGNOSIS — F5081 Binge eating disorder: Secondary | ICD-10-CM

## 2022-12-19 DIAGNOSIS — G4709 Other insomnia: Secondary | ICD-10-CM

## 2022-12-19 MED ORDER — LISDEXAMFETAMINE DIMESYLATE 30 MG PO CAPS: 30.0000 mg | ORAL_CAPSULE | Freq: Every day | ORAL | 0 refills | Status: DC

## 2022-12-19 MED ORDER — TRAZODONE HCL 100 MG PO TABS: 200.0000 mg | ORAL_TABLET | Freq: Every day | ORAL | 1 refills | Status: DC

## 2022-12-19 MED ORDER — DULOXETINE HCL 60 MG PO CPEP: 120.0000 mg | ORAL_CAPSULE | Freq: Every day | ORAL | 2 refills | Status: DC

## 2022-12-19 NOTE — Patient Instructions (Addendum)
Keep up the good work with having consistent meals and having nutritious food options available.  Also continue to cut back on caffeine as you are able and that should make it easier to sleep at night.  We titrated the Vyvanse (lisdexamfetamine) to 30 mg once daily today.  We otherwise kept the trazodone and Cymbalta the same.
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
## 2023-01-21 ENCOUNTER — Encounter (HOSPITAL_COMMUNITY): Payer: Self-pay

## 2023-02-13 ENCOUNTER — Encounter (HOSPITAL_COMMUNITY): Payer: Self-pay | Admitting: Psychiatry

## 2023-02-13 ENCOUNTER — Telehealth (INDEPENDENT_AMBULATORY_CARE_PROVIDER_SITE_OTHER): Payer: BC Managed Care – PPO | Admitting: Psychiatry

## 2023-02-13 DIAGNOSIS — G4733 Obstructive sleep apnea (adult) (pediatric): Secondary | ICD-10-CM

## 2023-02-13 DIAGNOSIS — Z79899 Other long term (current) drug therapy: Secondary | ICD-10-CM

## 2023-02-13 DIAGNOSIS — E039 Hypothyroidism, unspecified: Secondary | ICD-10-CM

## 2023-02-13 DIAGNOSIS — F331 Major depressive disorder, recurrent, moderate: Secondary | ICD-10-CM | POA: Diagnosis not present

## 2023-02-13 DIAGNOSIS — F411 Generalized anxiety disorder: Secondary | ICD-10-CM | POA: Diagnosis not present

## 2023-02-13 DIAGNOSIS — G4709 Other insomnia: Secondary | ICD-10-CM | POA: Diagnosis not present

## 2023-02-13 DIAGNOSIS — F5081 Binge eating disorder: Secondary | ICD-10-CM

## 2023-02-13 DIAGNOSIS — F4310 Post-traumatic stress disorder, unspecified: Secondary | ICD-10-CM | POA: Diagnosis not present

## 2023-02-13 DIAGNOSIS — E669 Obesity, unspecified: Secondary | ICD-10-CM

## 2023-02-13 DIAGNOSIS — F50819 Binge eating disorder, unspecified: Secondary | ICD-10-CM

## 2023-02-13 DIAGNOSIS — F50814 Binge eating disorder, in remission: Secondary | ICD-10-CM

## 2023-02-13 MED ORDER — LISDEXAMFETAMINE DIMESYLATE 30 MG PO CAPS: 30.0000 mg | ORAL_CAPSULE | Freq: Every day | ORAL | 0 refills | Status: DC

## 2023-02-13 MED ORDER — ARIPIPRAZOLE 2 MG PO TABS: 2.0000 mg | ORAL_TABLET | Freq: Every day | ORAL | 1 refills | Status: DC
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
## 2023-02-13 NOTE — Patient Instructions (Signed)
Please coordinate with your PCP to obtain a vitamin D, B12, folate, iron panel, TSH with total T3 and free T4 and we will also want to check a lipid panel, A1c, EKG while you are on the Abilify.  We added Abilify (aripiprazole) 2 mg daily to your regimen today but as we discussed we want to rule out physical causes of your fatigue and as you can please try to incorporate more wellness activities and rest to help your burnout.

## 2023-03-19 ENCOUNTER — Institutional Professional Consult (permissible substitution): Payer: Self-pay

## 2023-03-21 ENCOUNTER — Encounter (HOSPITAL_COMMUNITY): Payer: Self-pay | Admitting: Psychiatry

## 2023-03-21 ENCOUNTER — Telehealth (HOSPITAL_COMMUNITY): Payer: BC Managed Care – PPO | Admitting: Psychiatry

## 2023-03-21 DIAGNOSIS — F5081 Binge eating disorder: Secondary | ICD-10-CM

## 2023-03-21 DIAGNOSIS — F331 Major depressive disorder, recurrent, moderate: Secondary | ICD-10-CM | POA: Diagnosis not present

## 2023-03-21 DIAGNOSIS — Z79899 Other long term (current) drug therapy: Secondary | ICD-10-CM | POA: Insufficient documentation

## 2023-03-21 DIAGNOSIS — G4709 Other insomnia: Secondary | ICD-10-CM

## 2023-03-21 DIAGNOSIS — F4310 Post-traumatic stress disorder, unspecified: Secondary | ICD-10-CM | POA: Diagnosis not present

## 2023-03-21 DIAGNOSIS — F411 Generalized anxiety disorder: Secondary | ICD-10-CM | POA: Diagnosis not present

## 2023-03-21 DIAGNOSIS — G8929 Other chronic pain: Secondary | ICD-10-CM

## 2023-03-21 DIAGNOSIS — F50819 Binge eating disorder, unspecified: Secondary | ICD-10-CM

## 2023-03-21 MED ORDER — DULOXETINE HCL 60 MG PO CPEP: 120.0000 mg | ORAL_CAPSULE | Freq: Every day | ORAL | 2 refills | Status: DC

## 2023-03-21 MED ORDER — LISDEXAMFETAMINE DIMESYLATE 30 MG PO CAPS: 30.0000 mg | ORAL_CAPSULE | Freq: Every day | ORAL | 0 refills | Status: DC

## 2023-03-21 MED ORDER — ARIPIPRAZOLE 5 MG PO TABS: 2.5000 mg | ORAL_TABLET | Freq: Every day | ORAL | 1 refills | Status: DC

## 2023-03-21 MED ORDER — TRAZODONE HCL 100 MG PO TABS: 200.0000 mg | ORAL_TABLET | Freq: Every day | ORAL | 1 refills | Status: DC

## 2023-03-21 NOTE — Patient Instructions (Addendum)
The only medication change we made today was increasing the abilify to 2.5mg  daily, you will take this by cutting a 5mg  tablet in half. While you are on abilify, try to Coordinate with PCP for lipid panel, A1c and EKG. The other blood test we would want to check: vitamin D, B12, folate, iron panel, TSH with total T3 and free T4.  Please contact your insurer for a therapist that is in network.
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
## 2023-04-08 ENCOUNTER — Other Ambulatory Visit (HOSPITAL_COMMUNITY): Payer: Self-pay | Admitting: Psychiatry

## 2023-04-08 DIAGNOSIS — F331 Major depressive disorder, recurrent, moderate: Secondary | ICD-10-CM

## 2023-04-17 ENCOUNTER — Encounter (HOSPITAL_COMMUNITY): Payer: Self-pay | Admitting: Psychiatry

## 2023-04-17 ENCOUNTER — Telehealth (HOSPITAL_COMMUNITY): Payer: BC Managed Care – PPO | Admitting: Psychiatry

## 2023-04-17 DIAGNOSIS — F411 Generalized anxiety disorder: Secondary | ICD-10-CM

## 2023-04-17 DIAGNOSIS — F3341 Major depressive disorder, recurrent, in partial remission: Secondary | ICD-10-CM

## 2023-04-17 DIAGNOSIS — F4310 Post-traumatic stress disorder, unspecified: Secondary | ICD-10-CM

## 2023-04-17 DIAGNOSIS — Z79899 Other long term (current) drug therapy: Secondary | ICD-10-CM

## 2023-04-17 DIAGNOSIS — F50814 Binge eating disorder, in remission: Secondary | ICD-10-CM

## 2023-04-17 DIAGNOSIS — G4709 Other insomnia: Secondary | ICD-10-CM
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
## 2023-04-17 NOTE — Patient Instructions (Signed)
We did not make any medication changes today.  Keep up the good work with trying to cut back on caffeine and you could consider switching to green tea or decaf coffee to assist with this.  When you see your PCP be sure to get an updated EKG, lipid panel, A1c for monitoring while we are using the Abilify.

## 2023-05-29 ENCOUNTER — Telehealth (HOSPITAL_COMMUNITY): Payer: BC Managed Care – PPO | Admitting: Psychiatry

## 2023-06-02 ENCOUNTER — Other Ambulatory Visit (HOSPITAL_COMMUNITY): Payer: Self-pay | Admitting: Psychiatry

## 2023-06-02 DIAGNOSIS — G4709 Other insomnia: Secondary | ICD-10-CM

## 2023-06-12 ENCOUNTER — Telehealth (HOSPITAL_COMMUNITY): Payer: BC Managed Care – PPO | Admitting: Psychiatry

## 2023-06-12 ENCOUNTER — Encounter (HOSPITAL_COMMUNITY): Payer: Self-pay | Admitting: Psychiatry

## 2023-06-12 DIAGNOSIS — F331 Major depressive disorder, recurrent, moderate: Secondary | ICD-10-CM | POA: Diagnosis not present

## 2023-06-12 DIAGNOSIS — F411 Generalized anxiety disorder: Secondary | ICD-10-CM

## 2023-06-12 DIAGNOSIS — G4709 Other insomnia: Secondary | ICD-10-CM

## 2023-06-12 DIAGNOSIS — F4310 Post-traumatic stress disorder, unspecified: Secondary | ICD-10-CM

## 2023-06-12 DIAGNOSIS — F50811 Binge eating disorder, moderate: Secondary | ICD-10-CM | POA: Diagnosis not present

## 2023-06-12 DIAGNOSIS — G8929 Other chronic pain: Secondary | ICD-10-CM

## 2023-06-12 MED ORDER — LISDEXAMFETAMINE DIMESYLATE 30 MG PO CAPS: 30.0000 mg | ORAL_CAPSULE | Freq: Every day | ORAL | 0 refills | Status: DC

## 2023-06-12 MED ORDER — ARIPIPRAZOLE 5 MG PO TABS: 2.5000 mg | ORAL_TABLET | Freq: Every day | ORAL | 2 refills | Status: DC

## 2023-06-12 MED ORDER — DULOXETINE HCL 60 MG PO CPEP: 120.0000 mg | ORAL_CAPSULE | Freq: Every day | ORAL | 2 refills | Status: DC

## 2023-06-12 MED ORDER — TRAZODONE HCL 100 MG PO TABS: 200.0000 mg | ORAL_TABLET | Freq: Every day | ORAL | 2 refills | Status: DC

## 2023-06-12 NOTE — Patient Instructions (Signed)
If you get a chance, please have your PCP send over the results of your recent blood work and if they obtained an EKG to send that over as well but if they have not obtained one we need one for our records while you are on the Abilify as this can impact the QTc interval.  We did not make any medication changes today.
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
## 2023-09-11 ENCOUNTER — Telehealth (HOSPITAL_COMMUNITY): Payer: BC Managed Care – PPO | Admitting: Psychiatry

## 2023-09-12 ENCOUNTER — Encounter (HOSPITAL_COMMUNITY): Payer: Self-pay | Admitting: Psychiatry

## 2023-09-12 ENCOUNTER — Telehealth (HOSPITAL_COMMUNITY): Admitting: Psychiatry

## 2023-09-12 DIAGNOSIS — F50811 Binge eating disorder, moderate: Secondary | ICD-10-CM | POA: Diagnosis not present

## 2023-09-12 DIAGNOSIS — F411 Generalized anxiety disorder: Secondary | ICD-10-CM

## 2023-09-12 DIAGNOSIS — G8929 Other chronic pain: Secondary | ICD-10-CM

## 2023-09-12 DIAGNOSIS — F3341 Major depressive disorder, recurrent, in partial remission: Secondary | ICD-10-CM

## 2023-09-12 DIAGNOSIS — G4709 Other insomnia: Secondary | ICD-10-CM

## 2023-09-12 DIAGNOSIS — Z79899 Other long term (current) drug therapy: Secondary | ICD-10-CM

## 2023-09-12 DIAGNOSIS — F331 Major depressive disorder, recurrent, moderate: Secondary | ICD-10-CM | POA: Diagnosis not present

## 2023-09-12 DIAGNOSIS — F4310 Post-traumatic stress disorder, unspecified: Secondary | ICD-10-CM

## 2023-09-12 MED ORDER — TRAZODONE HCL 100 MG PO TABS: 200.0000 mg | ORAL_TABLET | Freq: Every day | ORAL | 2 refills | Status: DC

## 2023-09-12 MED ORDER — LISDEXAMFETAMINE DIMESYLATE 30 MG PO CAPS: 30.0000 mg | ORAL_CAPSULE | Freq: Every day | ORAL | 0 refills | Status: DC

## 2023-09-12 MED ORDER — DULOXETINE HCL 60 MG PO CPEP: 120.0000 mg | ORAL_CAPSULE | Freq: Every day | ORAL | 2 refills | Status: AC

## 2023-09-12 MED ORDER — ARIPIPRAZOLE 5 MG PO TABS: 2.5000 mg | ORAL_TABLET | Freq: Every day | ORAL | 2 refills | Status: DC

## 2023-09-12 MED ORDER — LISDEXAMFETAMINE DIMESYLATE 30 MG PO CAPS: 30.0000 mg | ORAL_CAPSULE | Freq: Every day | ORAL | 0 refills | Status: AC

## 2023-09-12 NOTE — Patient Instructions (Signed)
 We did not make any medication changes today but with the irregular fill history of Vyvanse we will need to get another urine toxicology if you can swing by the clinic to do so.  Please coordinate with your primary care provider to get an updated EKG while you are on the Abilify.
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
## 2023-11-06 ENCOUNTER — Emergency Department (HOSPITAL_COMMUNITY)
Admission: EM | Admit: 2023-11-06 | Discharge: 2023-11-07 | Disposition: A | Discharge status: Home / Self Care | Attending: Emergency Medicine | Admitting: Emergency Medicine

## 2023-11-06 ENCOUNTER — Other Ambulatory Visit: Payer: Self-pay

## 2023-11-06 ENCOUNTER — Emergency Department (HOSPITAL_COMMUNITY)

## 2023-11-06 ENCOUNTER — Encounter (HOSPITAL_COMMUNITY): Payer: Self-pay

## 2023-11-06 DIAGNOSIS — M25551 Pain in right hip: Secondary | ICD-10-CM | POA: Diagnosis present

## 2023-11-06 DIAGNOSIS — M25562 Pain in left knee: Secondary | ICD-10-CM | POA: Insufficient documentation

## 2023-11-06 NOTE — ED Triage Notes (Signed)
 Pt arrived from home via POV c/o right side hip pain 8/10 that is now limiting her ROM. Pain x 1 month that has gotten rapidly worse over the last 4 days.

## 2023-11-07 MED ORDER — MELOXICAM 15 MG PO TABS: 15.0000 mg | ORAL_TABLET | Freq: Every day | ORAL | 0 refills | Status: DC

## 2023-11-07 MED ORDER — OXYCODONE-ACETAMINOPHEN 5-325 MG PO TABS: 1.0000 | ORAL_TABLET | Freq: Four times a day (QID) | ORAL | 0 refills | Status: AC | PRN

## 2023-11-07 MED ORDER — METHOCARBAMOL 500 MG PO TABS: 500.0000 mg | ORAL_TABLET | Freq: Three times a day (TID) | ORAL | 0 refills | Status: AC | PRN

## 2023-11-07 MED ORDER — KETOROLAC TROMETHAMINE 60 MG/2ML IM SOLN
60.0000 mg | Freq: Once | INTRAMUSCULAR | Status: AC
  Administered 2023-11-07: 60 mg via INTRAMUSCULAR
  Filled 2023-11-07: qty 2

## 2023-11-08 NOTE — ED Provider Notes (Signed)
 [place] EMERGENCY DEPARTMENT AT [facility] Provider Note   CSN: [id number] Arrival date & time: 11/06/23  1900     History  Chief Complaint  Patient presents with   Hip Pain    Mary Paul is a 53 y.o. female.  Patient with chronic hip pain but last few days has progressively worsened. Points to her lower inguinal ligament/inner thigh as the area of discomfort. States she takes care of her grandchildren and often is having to run around throughout the day. No falls. No injuries. No fever. No other associated symptoms. Has a history of knee surgery on same (right) side for some type of problem when she was a child. Also some left knee pain making her favor that leg.    Hip Pain       Home Medications Prior to Admission medications   Medication Sig Start Date End Date Taking? Authorizing Provider  meloxicam  (MOBIC ) 15 MG tablet Take 1 tablet (15 mg total) by mouth daily. 11/07/23  Yes Ercel Normoyle, Reymundo Caulk, MD  methocarbamol  (ROBAXIN ) 500 MG tablet Take 1 tablet (500 mg total) by mouth every 8 (eight) hours as needed for muscle spasms. 11/07/23  Yes Hermenia Fritcher, Reymundo Caulk, MD  oxyCODONE -acetaminophen  (PERCOCET) 5-325 MG tablet Take 1 tablet by mouth every 6 (six) hours as needed for severe pain (pain score 7-10). 11/07/23  Yes Aylin Rhoads, Reymundo Caulk, MD  oxyCODONE -acetaminophen  (PERCOCET/ROXICET) 5-325 MG tablet Take 1 tablet by mouth every 6 (six) hours as needed for severe pain (pain score 7-10). 11/07/23  Yes Robi Mitter, Reymundo Caulk, MD  ARIPiprazole  (ABILIFY ) 5 MG tablet Take 0.5 tablets (2.5 mg total) by mouth daily. 09/12/23   Madie Schilling, MD  DULoxetine  (CYMBALTA ) 60 MG capsule Take 2 capsules (120 mg total) by mouth daily. 09/12/23 12/11/23  Madie Schilling, MD  lisdexamfetamine (VYVANSE ) 30 MG capsule Take 1 capsule (30 mg total) by mouth daily. 09/16/23 10/16/23  Madie Schilling, MD  NP THYROID  120 MG tablet Take 1 tablet by mouth twice daily 08/24/20 09/23/20  Gosrani, Nimish C, MD   traZODone  (DESYREL ) 100 MG tablet Take 2 tablets (200 mg total) by mouth at bedtime. 09/12/23   Madie Schilling, MD      Allergies    Erythromycin    Review of Systems   Review of Systems  Physical Exam Updated Vital Signs BP 126/70   Pulse 79   Temp 97.8 F (36.6 C) (Oral)   Resp 18   Ht 5\' 2"  (1.575 m)   Wt 133.8 kg   LMP 04/26/2018   SpO2 98%   BMI 53.96 kg/m  Physical Exam Vitals and nursing note reviewed.  Constitutional:      Appearance: She is well-developed.  HENT:     Head: Normocephalic and atraumatic.  Cardiovascular:     Rate and Rhythm: Normal rate and regular rhythm.  Pulmonary:     Effort: No respiratory distress.     Breath sounds: No stridor.  Abdominal:     General: There is no distension.  Musculoskeletal:        General: Tenderness (over left hip adductors, minimal with pROM, severe with weight bearing or aROM) present.     Cervical back: Normal range of motion.  Neurological:     Mental Status: She is alert.     ED Results / Procedures / Treatments   Labs (all labs ordered are listed, but only abnormal results are displayed) Labs Reviewed - No data to display  EKG None  Radiology DG  Hip Unilat  With Pelvis 2-3 Views Right Result Date: 11/06/2023 CLINICAL DATA:  Right hip pain, chronic, worsening EXAM: DG HIP (WITH OR WITHOUT PELVIS) 2-3V RIGHT COMPARISON:  None Available. FINDINGS: There is no evidence of hip fracture or dislocation. There is no evidence of arthropathy or other focal bone abnormality. IMPRESSION: Negative. Electronically Signed   By: Janeece Mechanic M.D.   On: 11/06/2023 21:06    Procedures Procedures    Medications Ordered in ED Medications  ketorolac  (TORADOL ) injection 60 mg (60 mg Intramuscular Given 11/07/23 0052)    ED Course/ Medical Decision Making/ A&P                                 Medical Decision Making Amount and/or Complexity of Data Reviewed Radiology: ordered.  Risk Prescription drug  management.   Xr overall reassuring. Definitely no fracture. Maybe some mild OA/osteophyte on my interpretation. Discussed symptomatic treatment and pcp fu if not improvign with stretch, exercise, mild rest.    Final Clinical Impression(s) / ED Diagnoses Final diagnoses:  Right hip pain    Rx / DC Orders ED Discharge Orders          Ordered    oxyCODONE -acetaminophen  (PERCOCET/ROXICET) 5-325 MG tablet  Every 6 hours PRN        11/07/23 0050    oxyCODONE -acetaminophen  (PERCOCET) 5-325 MG tablet  Every 6 hours PRN        11/07/23 0050    methocarbamol  (ROBAXIN ) 500 MG tablet  Every 8 hours PRN        11/07/23 0050    meloxicam  (MOBIC ) 15 MG tablet  Daily        11/07/23 0050    AMB referral to orthopedics        11/07/23 0050              Terrel Nesheiwat, Reymundo Caulk, MD 11/08/23 [phone]

## 2023-11-10 MED FILL — Oxycodone w/ Acetaminophen Tab 5-325 MG: ORAL | Qty: 6 | Status: AC

## 2023-11-11 ENCOUNTER — Telehealth (HOSPITAL_COMMUNITY): Admitting: Psychiatry

## 2023-12-01 ENCOUNTER — Ambulatory Visit: Admitting: Orthopedic Surgery

## 2023-12-01 VITALS — BP 116/77 | HR 100 | Ht 62.0 in | Wt 291.0 lb

## 2023-12-01 DIAGNOSIS — S73191A Other sprain of right hip, initial encounter: Secondary | ICD-10-CM

## 2023-12-01 DIAGNOSIS — M25551 Pain in right hip: Secondary | ICD-10-CM | POA: Diagnosis not present

## 2023-12-01 MED ORDER — MELOXICAM 15 MG PO TABS: 15.0000 mg | ORAL_TABLET | Freq: Every day | ORAL | 0 refills | Status: AC
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
## 2023-12-01 NOTE — Patient Instructions (Signed)
 While we are working on your approval for MRI please go ahead and call to schedule your appointment with [facility] within at least one (1) week.   Central Scheduling [phone]

## 2023-12-01 NOTE — Progress Notes (Signed)
  Intake history:  LMP 04/26/2018  There is no height or weight on file to calculate BMI.    WHAT ARE WE SEEING YOU FOR TODAY?   right hip(s) continues to hurt-   How long has this bothered you? (DOI?DOS?WS?)  4 week(s) ago- May 178  Anticoag.  No  Diabetes No  Heart disease No  Hypertension No  SMOKING HX No  Kidney disease No  Any ALLERGIES ______________________________________________   Treatment:  Have you taken:  Tylenol  Yes  Advil  No  Had PT No  Had injection No  Other  _________________________

## 2024-03-12 ENCOUNTER — Encounter: Payer: Self-pay | Admitting: *Deleted
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
## 2024-07-14 ENCOUNTER — Encounter: Payer: Self-pay | Admitting: Psychiatry

## 2024-07-14 ENCOUNTER — Ambulatory Visit (INDEPENDENT_AMBULATORY_CARE_PROVIDER_SITE_OTHER): Admitting: Psychiatry

## 2024-07-14 DIAGNOSIS — F4310 Post-traumatic stress disorder, unspecified: Secondary | ICD-10-CM | POA: Diagnosis not present

## 2024-07-14 DIAGNOSIS — G4709 Other insomnia: Secondary | ICD-10-CM | POA: Diagnosis not present

## 2024-07-14 DIAGNOSIS — F411 Generalized anxiety disorder: Secondary | ICD-10-CM

## 2024-07-14 DIAGNOSIS — F331 Major depressive disorder, recurrent, moderate: Secondary | ICD-10-CM | POA: Diagnosis not present

## 2024-07-14 MED ORDER — DULOXETINE HCL 30 MG PO CPEP: 30.0000 mg | ORAL_CAPSULE | Freq: Every day | ORAL | 0 refills | Status: AC

## 2024-07-14 MED ORDER — TRAZODONE HCL 100 MG PO TABS: 200.0000 mg | ORAL_TABLET | Freq: Every day | ORAL | 1 refills | Status: AC

## 2024-07-14 MED ORDER — DULOXETINE HCL 60 MG PO CPEP: 60.0000 mg | ORAL_CAPSULE | Freq: Every day | ORAL | 1 refills | Status: AC

## 2024-07-14 NOTE — Patient Instructions (Signed)
 After one week of increasing trazodone , start duloxetine  30 mg daily for two weeks, then 60 mg daily Continue vraylar 1.5 mg daily Increase trazodone  150 mg at night as needed for insomnia Please contact [facility] to establish care with therapist Next appointment- 3/5 at 11 am

## 2024-07-16 ENCOUNTER — Encounter: Payer: Self-pay | Admitting: *Deleted

## 2024-07-19 ENCOUNTER — Encounter: Payer: Self-pay | Admitting: *Deleted

## 2024-07-19 ENCOUNTER — Encounter: Admitting: Obstetrics & Gynecology

## 2024-07-21 ENCOUNTER — Encounter: Payer: Self-pay | Admitting: Obstetrics & Gynecology

## 2024-07-21 ENCOUNTER — Other Ambulatory Visit (HOSPITAL_COMMUNITY)
Admission: RE | Admit: 2024-07-21 | Discharge: 2024-07-21 | Disposition: A | Source: Ambulatory Visit | Attending: Obstetrics & Gynecology | Admitting: Obstetrics & Gynecology

## 2024-07-21 ENCOUNTER — Ambulatory Visit: Admitting: Obstetrics & Gynecology

## 2024-07-21 VITALS — BP 138/84 | Ht 62.0 in | Wt 297.0 lb

## 2024-07-21 DIAGNOSIS — Z124 Encounter for screening for malignant neoplasm of cervix: Secondary | ICD-10-CM | POA: Insufficient documentation

## 2024-07-21 DIAGNOSIS — N952 Postmenopausal atrophic vaginitis: Secondary | ICD-10-CM | POA: Diagnosis not present

## 2024-07-21 DIAGNOSIS — L304 Erythema intertrigo: Secondary | ICD-10-CM | POA: Diagnosis not present

## 2024-07-21 DIAGNOSIS — Z1151 Encounter for screening for human papillomavirus (HPV): Secondary | ICD-10-CM

## 2024-07-21 DIAGNOSIS — N941 Unspecified dyspareunia: Secondary | ICD-10-CM | POA: Diagnosis not present

## 2024-07-21 DIAGNOSIS — Z01419 Encounter for gynecological examination (general) (routine) without abnormal findings: Secondary | ICD-10-CM | POA: Diagnosis not present

## 2024-07-21 MED ORDER — NYSTATIN-TRIAMCINOLONE 100000-0.1 UNIT/GM-% EX OINT: TOPICAL_OINTMENT | CUTANEOUS | 2 refills | Status: AC

## 2024-07-21 MED ORDER — ESTRADIOL 10 MCG VA TABS: 10.0000 ug | ORAL_TABLET | Freq: Every evening | VAGINAL | 0 refills | Status: AC

## 2024-07-21 MED ORDER — ESTRADIOL 10 MCG VA TABS: 10.0000 ug | ORAL_TABLET | VAGINAL | 3 refills | Status: AC

## 2024-07-21 NOTE — Progress Notes (Signed)
 "  WELL-WOMAN EXAMINATION Patient name: Mary Paul MRN [id number]  Date of birth: 08-Oct-1970 Chief Complaint:   Annual Exam  History of Present Illness:   Mary Paul is a 54 y.o. G1P1000 PM female being seen today for a routine well-woman exam and the following concerns:  -Dyspareunia: Despite using lubricants notes considerable pain with IC.  Denies vaginal bleeding.  Denies vaginal discharge, itching or irritation.  Denies pelvic or abdominal pain.  She also notes issues with a rash when she is sweaty.  Most noticeable under her pannus and armpit area.  Denies hot flashes during the day, but does note some night sweats or feeling specifically warm at night.  Also notes that her 2 grand children are now staying with her and they add some extra heat too.  Notes stress incontinence- not significant concern  Patient's last menstrual period was 04/26/2018. The current method of family planning is post menopausal status.    Last pap collected today.  Last mammogram: ordered. Last colonoscopy: to be scheduled     03/19/2022    1:58 PM 09/30/2019    8:25 AM 09/30/2019    8:14 AM 05/28/2018    9:06 AM 08/28/2016    3:14 PM  Depression screen PHQ 2/9  Decreased Interest  0 0 0 0  Down, Depressed, Hopeless  0 0 3 0  PHQ - 2 Score  0 0 3 0  Altered sleeping    3   Tired, decreased energy    3   Change in appetite    1   Feeling bad or failure about yourself     1   Trouble concentrating    3   Moving slowly or fidgety/restless    0   Suicidal thoughts    0   PHQ-9 Score    14    Difficult doing work/chores          Information is confidential and restricted. Go to Review Flowsheets to unlock data.   Data saved with a previous flowsheet row definition      Review of Systems:   Pertinent items are noted in HPI Denies any headaches, blurred vision, fatigue, shortness of breath, chest pain, abdominal pain, bowel movements, urination, or intercourse unless otherwise stated  above.  Pertinent History Reviewed:  Reviewed past medical,surgical, social and family history.  Reviewed problem list, medications and allergies. Physical Assessment:   Vitals:   07/21/24 1028  BP: 138/84  Weight: 297 lb (134.7 kg)  Height: 5' 2 (1.575 m)  Body mass index is 54.32 kg/m.        Physical Examination:   General appearance - well appearing, and in no distress  Mental status - alert, oriented to person, place, and time  Psych:  She has a normal mood and affect  Skin - warm and dry, normal color, no suspicious lesions noted  Chest - effort normal, all lung fields clear to auscultation bilaterally  Heart - normal rate and regular rhythm  Neck:  midline trachea, no thyromegaly or nodules  Breasts - breasts appear normal, no suspicious masses, no skin or nipple changes or  axillary nodes  Abdomen - obese, under pannus- mirror- bright red rash noted.  soft, nontender, non-distended.  No masses noted  Pelvic - VULVA: normal appearing vulva with no masses, tenderness or lesions  VAGINA: normal appearing vagina with normal color and discharge, no lesions.  Some flattening of vaginal rugae noted CERVIX: normal appearing cervix without discharge or lesions, no  CMT  Thin prep pap is done with HR HPV cotesting  UTERUS: uterus is felt to be normal size, shape, consistency and nontender   ADNEXA: No adnexal masses or tenderness noted- bimanual exam limited due to body habitus  Extremities:  Minimal edema, no calf tenderness bilaterally  Chaperone: Mary Paul     Assessment & Plan:  1) Well-Woman Exam -pap collected, reviewed ASCCP guidelines - Mammogram to be completed  2) Dyspareunia/vaginal atrophy -recommend vaginal estrogen  -pt denies h/o VTE -Rx sent in to use nightly x 2 weeks then twice per week  3) Intertrigo -reviewed conservative methods to help improve symptoms -Rx sent in for mycolog  Meds ordered this encounter  Medications   Estradiol  10 MCG TABS  vaginal tablet    Sig: Place 1 tablet (10 mcg total) vaginally at bedtime for 14 days.    Dispense:  14 tablet    Refill:  0   Estradiol  10 MCG TABS vaginal tablet    Sig: Place 1 tablet (10 mcg total) vaginally 2 (two) times a week.    Dispense:  24 tablet    Refill:  3    Start nightly x 2 weeks then twice per week   nystatin -triamcinolone  ointment (MYCOLOG)    Sig: Apply thin layer to affected area twice daily as needed    Dispense:  30 g    Refill:  2     No orders of the defined types were placed in this encounter.   Meds: No orders of the defined types were placed in this encounter.   Follow-up: No follow-ups on file.   Mary Sigman, DO Attending Obstetrician & Gynecologist, [facility] for [facility] [place], [facility]   "

## 2024-07-22 ENCOUNTER — Ambulatory Visit: Payer: Self-pay | Admitting: Obstetrics & Gynecology

## 2024-07-22 ENCOUNTER — Ambulatory Visit (HOSPITAL_COMMUNITY)

## 2024-07-22 LAB — CYTOLOGY - PAP
Comment: NEGATIVE
Diagnosis: NEGATIVE
High risk HPV: NEGATIVE

## 2024-07-28 ENCOUNTER — Ambulatory Visit (HOSPITAL_COMMUNITY)

## 2024-08-05 ENCOUNTER — Ambulatory Visit (HOSPITAL_COMMUNITY)

## 2024-08-26 ENCOUNTER — Ambulatory Visit: Admitting: Psychiatry
